# Patient Record
Sex: Female | Born: 1985 | Race: White | Hispanic: No | Marital: Married | Smoking: Former smoker
Health system: Southern US, Community
[De-identification: ages and names within clinical notes are randomized; demographics above are authoritative.]

## PROBLEM LIST (undated history)

## (undated) ENCOUNTER — Inpatient Hospital Stay (HOSPITAL_COMMUNITY): Payer: Self-pay

## (undated) DIAGNOSIS — F909 Attention-deficit hyperactivity disorder, unspecified type: Secondary | ICD-10-CM

## (undated) DIAGNOSIS — F419 Anxiety disorder, unspecified: Secondary | ICD-10-CM

## (undated) DIAGNOSIS — K589 Irritable bowel syndrome without diarrhea: Secondary | ICD-10-CM

## (undated) DIAGNOSIS — K219 Gastro-esophageal reflux disease without esophagitis: Secondary | ICD-10-CM

## (undated) DIAGNOSIS — Z87898 Personal history of other specified conditions: Secondary | ICD-10-CM

## (undated) DIAGNOSIS — E0500 Thyrotoxicosis with diffuse goiter without thyrotoxic crisis or storm: Secondary | ICD-10-CM

## (undated) DIAGNOSIS — G43909 Migraine, unspecified, not intractable, without status migrainosus: Secondary | ICD-10-CM

## (undated) DIAGNOSIS — K625 Hemorrhage of anus and rectum: Secondary | ICD-10-CM

## (undated) DIAGNOSIS — K649 Unspecified hemorrhoids: Secondary | ICD-10-CM

## (undated) DIAGNOSIS — D259 Leiomyoma of uterus, unspecified: Secondary | ICD-10-CM

## (undated) DIAGNOSIS — N83202 Unspecified ovarian cyst, left side: Secondary | ICD-10-CM

## (undated) HISTORY — PX: TONSILLECTOMY AND ADENOIDECTOMY: SUR1326

## (undated) HISTORY — PX: WISDOM TOOTH EXTRACTION: SHX21

## (undated) HISTORY — DX: Migraine, unspecified, not intractable, without status migrainosus: G43.909

## (undated) HISTORY — DX: Unspecified hemorrhoids: K64.9

## (undated) HISTORY — DX: Hemorrhage of anus and rectum: K62.5

---
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
## 2011-10-03 DIAGNOSIS — D2370 Other benign neoplasm of skin of unspecified lower limb, including hip: Secondary | ICD-10-CM | POA: Insufficient documentation

## 2011-10-03 DIAGNOSIS — D235 Other benign neoplasm of skin of trunk: Secondary | ICD-10-CM | POA: Insufficient documentation

## 2012-11-05 DIAGNOSIS — D229 Melanocytic nevi, unspecified: Secondary | ICD-10-CM

## 2012-11-05 HISTORY — DX: Melanocytic nevi, unspecified: D22.9

## 2013-04-01 ENCOUNTER — Encounter (INDEPENDENT_AMBULATORY_CARE_PROVIDER_SITE_OTHER): Payer: Self-pay | Admitting: General Surgery

## 2013-04-01 ENCOUNTER — Ambulatory Visit (INDEPENDENT_AMBULATORY_CARE_PROVIDER_SITE_OTHER): Payer: BC Managed Care – PPO | Admitting: General Surgery

## 2013-04-01 VITALS — BP 118/70 | HR 68 | Temp 97.9°F | Resp 16 | Ht 68.0 in | Wt 166.8 lb

## 2013-04-01 DIAGNOSIS — K602 Anal fissure, unspecified: Secondary | ICD-10-CM

## 2013-04-01 MED ORDER — LIDOCAINE HCL 2 % EX GEL: CUTANEOUS | Status: DC

## 2013-04-01 MED ORDER — DOCUSATE SODIUM 100 MG PO CAPS: 100.0000 mg | ORAL_CAPSULE | Freq: Two times a day (BID) | ORAL | Status: DC

## 2013-04-01 MED ORDER — LIDOCAINE HCL 2 % EX GEL: CUTANEOUS | Status: AC

## 2013-04-01 NOTE — Progress Notes (Signed)
Patient ID: Patricia Cruz, female   DOB: Sep 30, 1986, 27 y.o.   MRN: [id number]  Chief Complaint  Patient presents with  . Rectal Problems    Evaluate hems    HPI Patricia Cruz is a 27 y.o. female.  This patient is self-referred for evaluation of hemorrhoids. She says that in December 2000 and she had excision of the hemorrhoid by her gynecologist in the office but since that she has never had complete removal of the tissue and she says it has been increasing in size and discomfort since then. She says that she has bleeding with each bowel movement including bleeding into the bowl and on the tissue she says it is painful with each bowel movement and has to use baby wipes for hygiene. She says her bowels are normal but she does not take any stool softeners or fiber supplement.  HPI  Past Medical History  Diagnosis Date  . Migraine headache   . Rectal bleeding   . Hemorrhoids     Past Surgical History  Procedure Laterality Date  . Tonsillectomy  2006  . Wisdom tooth extraction  2006    No family history on file.  Social History History  Substance Use Topics  . Smoking status: Never Smoker   . Smokeless tobacco: Not on file  . Alcohol Use: Not on file    No Known Allergies  Current Outpatient Prescriptions  Medication Sig Dispense Refill  . FLUoxetine (PROZAC) 20 MG tablet Take 20 mg by mouth daily.      . Norethin Ace-Eth Estrad-FE (LOESTRIN 24 FE PO) Take by mouth.      . topiramate (TOPAMAX) 100 MG tablet Take 100 mg by mouth 2 (two) times daily.       No current facility-administered medications for this visit.    Review of Systems Review of Systems All other review of systems negative or noncontributory except as stated in the HPI  Blood pressure 118/70, pulse 68, temperature 97.9 F (36.6 C), temperature source Temporal, resp. rate 16, height 5\' 8"  (1.727 m), weight 166 lb 12.8 oz (75.66 kg).  Physical Exam Physical Exam Physical Exam  Nursing note and vitals  reviewed. Constitutional: She is oriented to person, place, and time. She appears well-developed and well-nourished. No distress.  HENT:  Head: Normocephalic and atraumatic.  Mouth/Throat: No oropharyngeal exudate.  Eyes: Conjunctivae and EOM are normal. Pupils are equal, round, and reactive to light. Right eye exhibits no discharge. Left eye exhibits no discharge. No scleral icterus.  Neck: Normal range of motion. Neck supple. No tracheal deviation present.  Cardiovascular: Normal rate, regular rhythm, normal heart sounds and intact distal pulses.   Pulmonary/Chest: Effort normal and breath sounds normal. No stridor. No respiratory distress. She has no wheezes.  Abdominal: Soft. Bowel sounds are normal. She exhibits no distension and no mass. There is no tenderness. There is no rebound and no guarding.  Musculoskeletal: Normal range of motion. She exhibits no edema and no tenderness.  Neurological: She is alert and oriented to person, place, and time.  Skin: Skin is warm and dry. No rash noted. She is not diaphoretic. No erythema. No pallor.  Psychiatric: She has a normal mood and affect. Her behavior is normal. Judgment and thought content normal.  Rectal:  Rectal exam very tender, but no internal masses.  She has a small amount of external skin anteriorly with an associated anal fissure.   Data Reviewed   Assessment    Anal fissure I think that the  majority of her symptoms are from an anal fissure which is fairly clearly visualized at the left anterior aspect of her anus closely associated with a small portion of the extra skin anteriorly. I think that her symptoms are fairly classic for anal fissure although she may have some hemorrhoids associated with this as well as though nothing obvious on exam. We had a long discussion regarding conservative management of this and I have recommended increasing fiber intake to 30 g of fiber per day as well as increasing her water intake and prescribe  some stool softeners as well. I also prescribed some topical Xylocaine to help with the discomfort associated with bowel movements and Rectiv for medical treatment of sphincter spasm and her anal fissure.  I recommended that she do this for 4-6 weeks and if she does not have improvement and resolution of her symptoms over that time then we will see her back to discuss possible rectal exam under anesthesia and possible sphincterotomy possible hemorrhoidectomy.    Plan    Stool softeners Increase fiber intake to 30 g per day Increase water intake Topical Xylocaine Rectiv F/u 4-6 weeks        Patricia Cruz DAVID 04/01/2013, 9:39 AM

## 2017-09-15 DIAGNOSIS — F419 Anxiety disorder, unspecified: Secondary | ICD-10-CM | POA: Insufficient documentation

## 2017-12-22 NOTE — L&D Delivery Note (Signed)
Patient was C/C/+2and pushed for 25 minutes with epidural.    NSVD female infant, Apgars 8/9, weight pending.  Presentation direct OA, restituted to LOA with <15 second shoulder dystocia resolved with McRobert's and suprapubic pressure. The patient had 2nd laceration repaired with 2-0 vicryl. Fundus was firm. EBL was expected amount. Placenta was delivered intact. Vagina was clear.  Delayed cord clamping done for 30-60 seconds while warming baby. Baby was vigorous and doing skin to skin with mother.  Allyn Kenner

## 2018-04-14 DIAGNOSIS — Z832 Family history of diseases of the blood and blood-forming organs and certain disorders involving the immune mechanism: Secondary | ICD-10-CM | POA: Insufficient documentation

## 2018-04-14 LAB — OB RESULTS CONSOLE GC/CHLAMYDIA
Chlamydia: NEGATIVE
Gonorrhea: NEGATIVE

## 2018-04-23 ENCOUNTER — Encounter (HOSPITAL_COMMUNITY): Payer: Self-pay

## 2018-04-23 ENCOUNTER — Ambulatory Visit (HOSPITAL_COMMUNITY): Payer: Self-pay

## 2018-04-27 ENCOUNTER — Ambulatory Visit (HOSPITAL_COMMUNITY)
Admission: RE | Admit: 2018-04-27 | Discharge: 2018-04-27 | Disposition: A | Discharge status: Home / Self Care | Payer: BLUE CROSS/BLUE SHIELD | Source: Ambulatory Visit | Attending: Obstetrics and Gynecology | Admitting: Obstetrics and Gynecology

## 2018-04-27 ENCOUNTER — Encounter (HOSPITAL_COMMUNITY): Payer: Self-pay

## 2018-04-27 DIAGNOSIS — Z832 Family history of diseases of the blood and blood-forming organs and certain disorders involving the immune mechanism: Secondary | ICD-10-CM | POA: Insufficient documentation

## 2018-04-27 HISTORY — DX: Anxiety disorder, unspecified: F41.9

## 2018-04-27 HISTORY — DX: Attention-deficit hyperactivity disorder, unspecified type: F90.9

## 2018-04-27 NOTE — Consult Note (Signed)
Maternal Fetal Medicine Consultation  Requesting Provider(s): Rogue Bussing  Primary OB: Rogue Bussing Reason for consultation: family history of FVL mutation 32yo P0 at 10+0 weeks referred for recent news that her mother has FVL mutation. She was undergoing workup for an unspecified allergic disorder and the test was performed. Her mother has no VTE history, nor does the patient or any members of her family. She reports no pregnancy problems today HPI: OB History: OB History    Gravida  1   Para      Term      Preterm      AB      Living        SAB      TAB      Ectopic      Multiple      Live Births              PMH:  Past Medical History:  Diagnosis Date  . ADHD   . Anxiety     PSH:  Past Surgical History:  Procedure Laterality Date  . TONSILLECTOMY AND ADENOIDECTOMY    . WISDOM TOOTH EXTRACTION     Meds: PNV Allergies: NKDA FH: no family history of thromboembolic phenomena, no congenital anomalies or genetic disease Soc: no tobacco, alcohol or illicit drugs  Review of Systems: no vaginal bleeding or cramping/contractions, no LOF, no nausea/vomiting. All other systems reviewed and are negative.  PE:  VS: BP 107/74  Pulse 99  Weight 208 lb GEN: well-appearing female ABD: gravid, NT  A/P: Family history of FVL mutation, unknown if hetero- or homozygous, with no personal or family history of VTE It is extremely unlikely that this patient has any pathological FVL mutation. She would need to be a homozygous FVL which is unlikely. However, FVL mutation testing will be drawn today. I have asked her to start 81mg  ASA for preeclampsia prophylaxis, and IF she is a FVL heterozygote, this will be sufficient in the face of no other risk factors for VTE. If she is a homozygote for FVL mutation then prophylactic dosage of lovenox or heparin is warrented. We will contact her with these results as soon as available  Thank you for the opportunity to be a part of the care of  Patricia Cruz. Please contact our office if we can be of further assistance.   I spent approximately 15 minutes with this patient with over 50% of time spent in face-to-face counseling.

## 2018-05-03 LAB — FACTOR 5 LEIDEN

## 2018-05-05 ENCOUNTER — Telehealth (HOSPITAL_COMMUNITY): Payer: Self-pay | Admitting: *Deleted

## 2018-05-12 LAB — OB RESULTS CONSOLE HIV ANTIBODY (ROUTINE TESTING)
HIV: NONREACTIVE
HIV: NONREACTIVE
HIV: NONREACTIVE

## 2018-05-12 LAB — OB RESULTS CONSOLE RUBELLA ANTIBODY, IGM: Rubella: IMMUNE

## 2018-07-21 ENCOUNTER — Other Ambulatory Visit (HOSPITAL_COMMUNITY): Payer: Self-pay | Admitting: Obstetrics and Gynecology

## 2018-07-21 DIAGNOSIS — Z363 Encounter for antenatal screening for malformations: Secondary | ICD-10-CM

## 2018-07-21 DIAGNOSIS — Z3A23 23 weeks gestation of pregnancy: Secondary | ICD-10-CM

## 2018-07-27 ENCOUNTER — Encounter (HOSPITAL_COMMUNITY): Payer: Self-pay

## 2018-07-27 ENCOUNTER — Ambulatory Visit (HOSPITAL_COMMUNITY)
Admission: RE | Admit: 2018-07-27 | Discharge: 2018-07-27 | Disposition: A | Discharge status: Home / Self Care | Payer: BLUE CROSS/BLUE SHIELD | Source: Ambulatory Visit | Attending: Obstetrics and Gynecology | Admitting: Obstetrics and Gynecology

## 2018-07-27 ENCOUNTER — Other Ambulatory Visit (HOSPITAL_COMMUNITY): Payer: Self-pay | Admitting: Obstetrics and Gynecology

## 2018-07-27 DIAGNOSIS — Z3A23 23 weeks gestation of pregnancy: Secondary | ICD-10-CM | POA: Diagnosis present

## 2018-07-27 DIAGNOSIS — O99212 Obesity complicating pregnancy, second trimester: Secondary | ICD-10-CM | POA: Diagnosis not present

## 2018-07-27 DIAGNOSIS — Z363 Encounter for antenatal screening for malformations: Secondary | ICD-10-CM | POA: Diagnosis not present

## 2018-07-27 DIAGNOSIS — O359XX0 Maternal care for (suspected) fetal abnormality and damage, unspecified, not applicable or unspecified: Secondary | ICD-10-CM

## 2018-07-27 IMAGING — US US MFM OB DETAIL+14 WK
1 series · 13 of 28 positions shown · non-contrast
Comparison: none

[Series 1: us mfm ob detail+14 wk · 98 acquisitions, 13 frames shown]
[im 4/98]
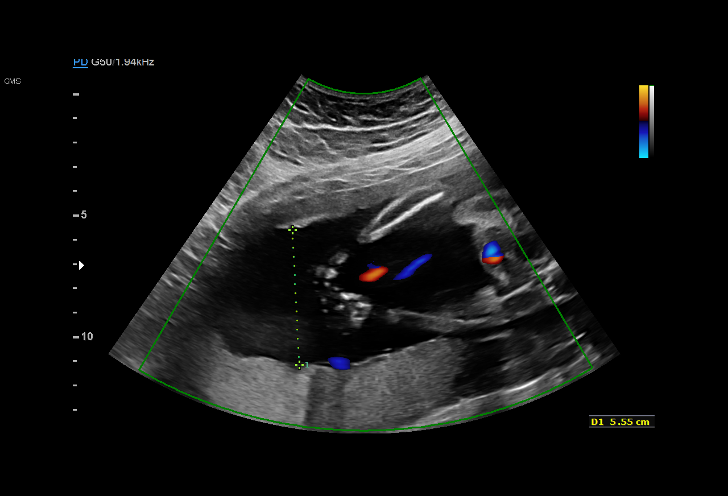
[im 11/98]
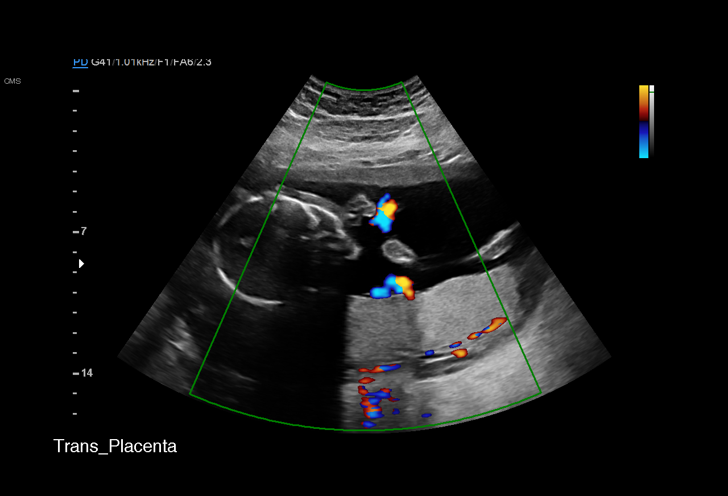
[im 18/98]
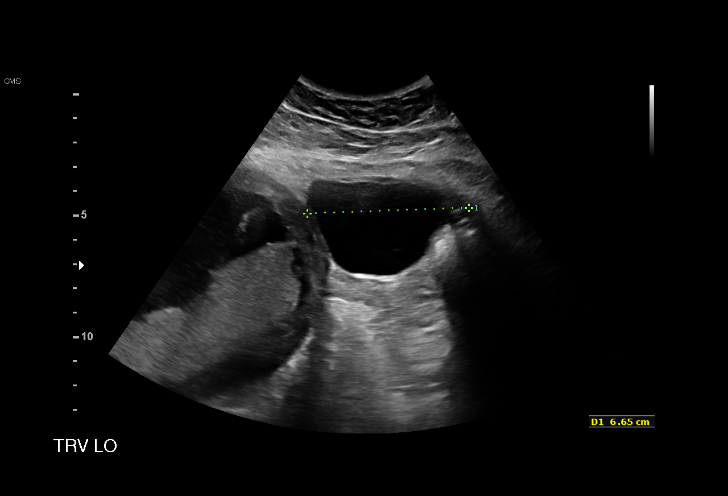
[im 26/98]
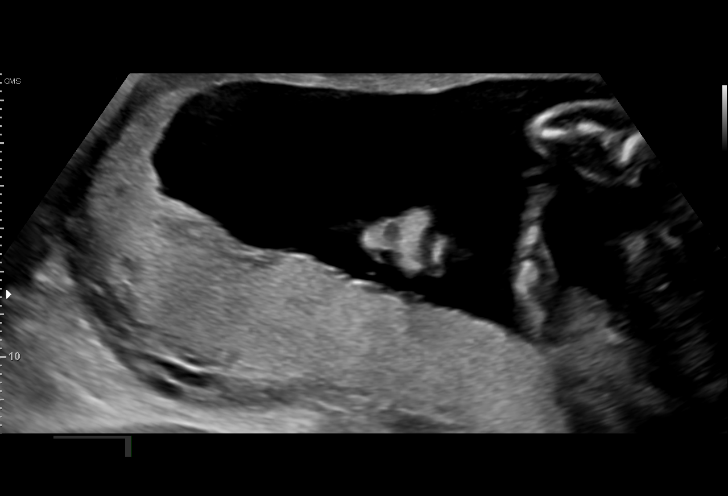
[im 33/98]
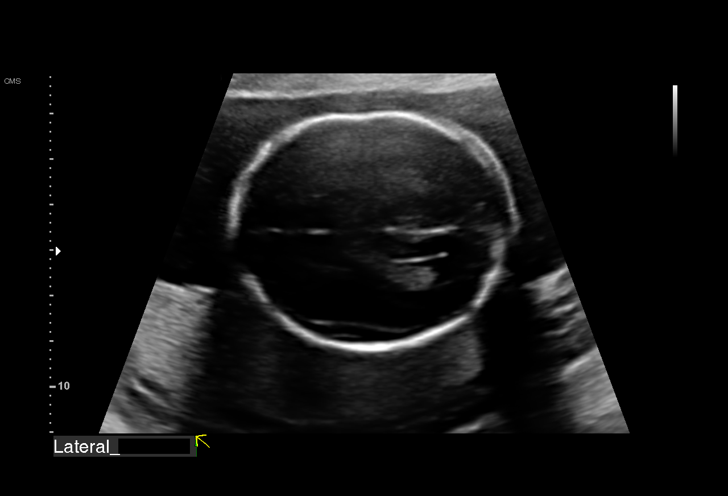
[im 40/98]
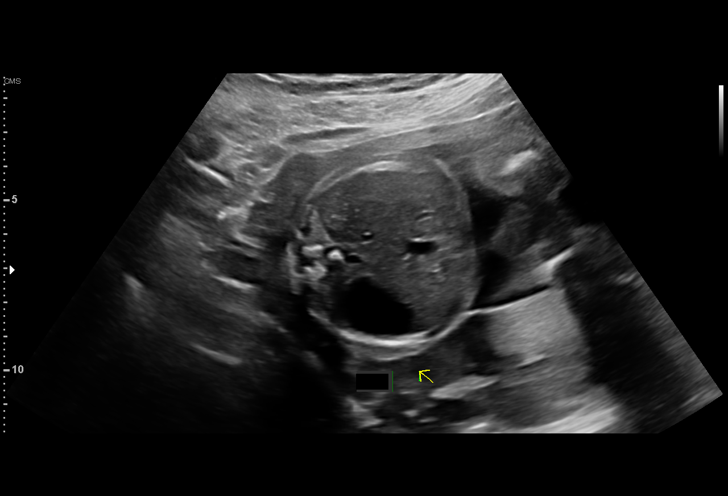
[im 51/98]
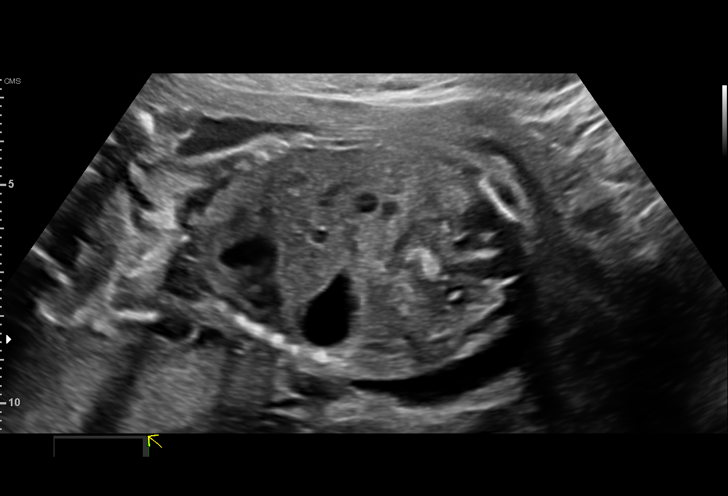
[im 58/98]
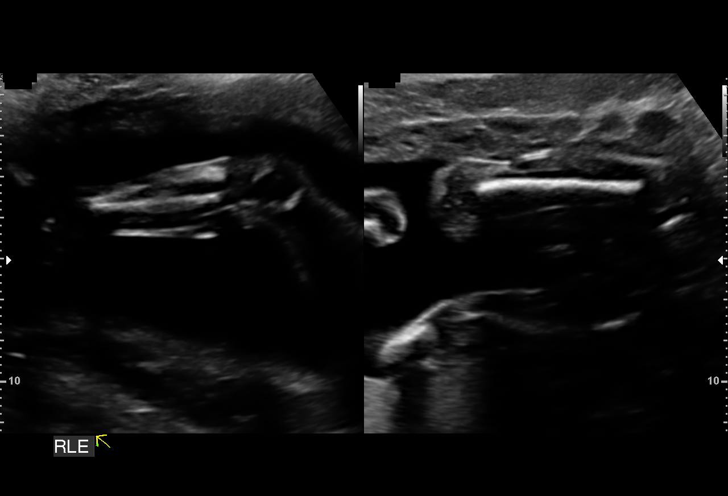
[im 65/98]
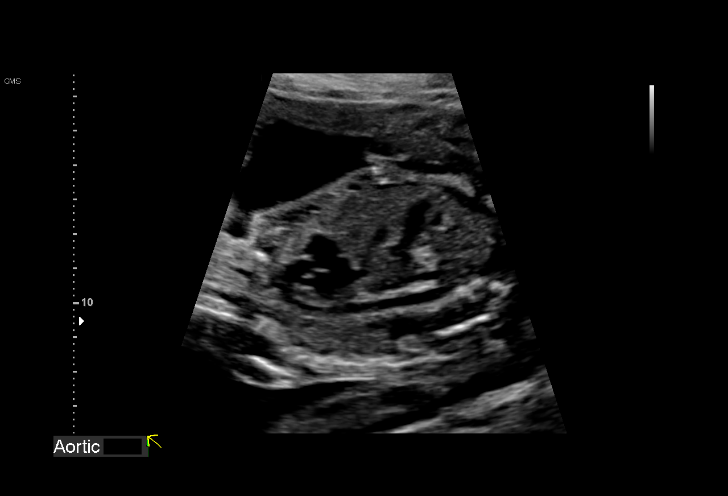
[im 72/98]
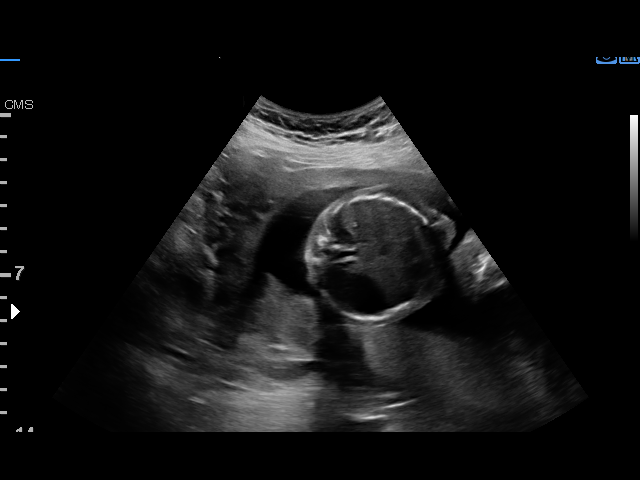
[im 80/98]
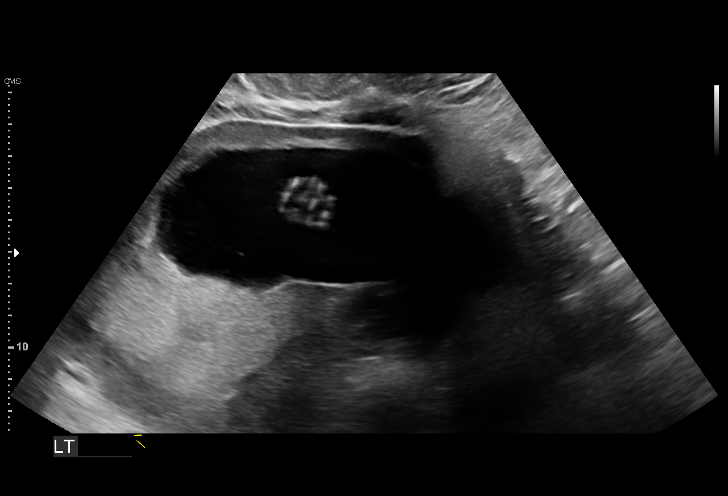
[im 87/98]
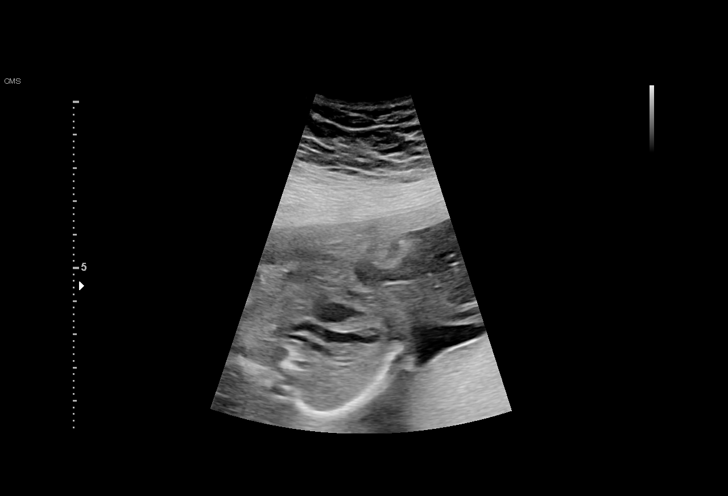
[im 94/98]
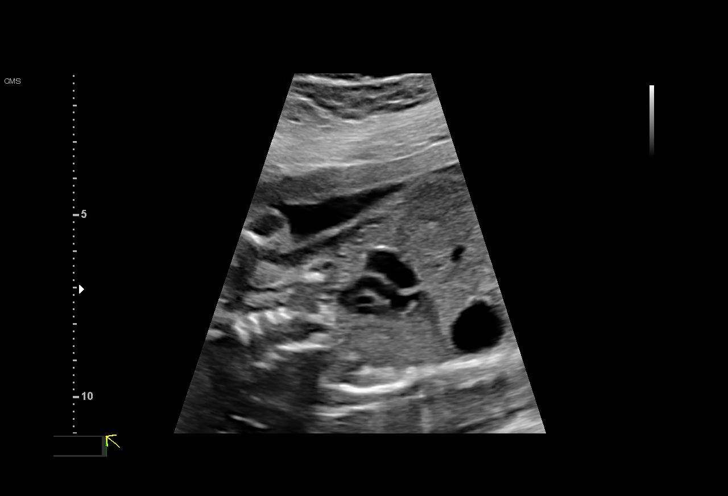

[13 of 28 positions shown; findings below may reference images not displayed]

OSEI OWUSU DO

1  OSEI OWUSU          [PHONE_NUMBER]      [PHONE_NUMBER]     [PHONE_NUMBER]
Indications

Encounter for antenatal screening for          [X7]
malformations
Obesity complicating pregnancy, second         [X7]
trimester (pregravid BMI 32)
23 weeks gestation of pregnancy
Fetal abnormality - other known or             [X7]
suspected (i.e. choriod plexus cyst, EIF,
renal pyelectasis)
OB History

Blood Type:            Height:  5'7"   Weight (lb):  209       BMI:
Gravidity:    1         Term:   0        Prem:   0        SAB:   0
TOP:          0       Ectopic:  0        Living: 0
Fetal Evaluation

Num Of Fetuses:     1
Fetal Heart         142
Rate(bpm):
Cardiac Activity:   Observed
Presentation:       Transverse, head to maternal right
Placenta:           Posterior
P. Cord Insertion:  Visualized

Amniotic Fluid
AFI FV:      Subjectively within normal limits

Largest Pocket(cm)
5.6
Biometry
BPD:      51.7  mm     G. Age:  21w 5d          7  %    CI:        71.37   %    70 - 86
FL/HC:      20.5   %    19.2 -
HC:      194.9  mm     G. Age:  21w 5d          4  %    HC/AC:      1.03        1.05 -
AC:       189   mm     G. Age:  23w 5d         63  %    FL/BPD:     77.2   %    71 - 87
FL:       39.9  mm     G. Age:  22w 6d         35  %    FL/AC:      21.1   %    20 - 24
HUM:      37.9  mm     G. Age:  23w 2d         49  %
CER:      24.9  mm     G. Age:  22w 6d         49  %
CM:        5.2  mm

Est. FW:     560  gm      1 lb 4 oz     55  %
Gestational Age

LMP:           23w 0d        Date:  [DATE]                 EDD:   [DATE]
U/S Today:     22w 4d                                        EDD:   [DATE]
Best:          23w 0d     Det. By:  LMP  ([DATE])          EDD:   [DATE]
Anatomy

Cranium:               Appears normal         Aortic Arch:            Appears normal
Cavum:                 Appears normal         Ductal Arch:            Appears normal
Ventricles:            Appears normal         Diaphragm:              Appears normal
Choroid Plexus:        Appears normal         Stomach:                Appears normal, left
sided
Cerebellum:            Appears normal         Abdomen:                Appears normal
Posterior Fossa:       Appears normal         Abdominal Wall:         Appears nml (cord
insert, abd wall)
Nuchal Fold:           Not applicable (>20    Cord Vessels:           Appears normal (3
wks GA)                                        vessel cord)
Face:                  Appears normal         Kidneys:                Appear normal
(orbits and profile)
Lips:                  Appears normal         Bladder:                Appears normal
Thoracic:              Appears normal         Spine:                  Appears normal
Heart:                 Appears normal         Upper Extremities:      Appears normal
(4CH, axis, and
situs)
RVOT:                  Appears normal         Lower Extremities:      Appears normal
LVOT:                  Appears normal

Other:  Heels and 5th digit visualized. Nasal bone visualized.
Cervix Uterus Adnexa

Cervix
Length:            3.8  cm.
Normal appearance by transabdominal scan.

Uterus
No abnormality visualized.

Left Ovary
Simple cyst measuring 6.7 x  6.6 x 4.4 cm.

Right Ovary
Within normal limits.

Cul De Sac:   No free fluid seen.

Adnexa:       No abnormality visualized.
Impression

Patient is here for fetal anatomy scan. On your office scan,
cardiac views could not be assessed.

She had MFM consultation in [DATE] (Dr. OSEI OWUSU)
because of her family history of Factor V Leiden mutation.
Subsequently, she screened negative for Factor V Leiden
mutation.

We performed fetal anatomy scan. No makers of
aneuploidies or fetal structural defects are seen. Cardiac
anatomy appears normal. Fetal biometry is consistent with
her previously-established dates. Amniotic fluid is normal and
good fetal activity is seen.
A left ovarian simple cyst is seen. I reassured the patient that
likelihood of torsion is very low. Patient does not have
symptoms pertaining to the ovarian cyst.
Recommendations

Follow-up scans as clinically indicated.

## 2018-07-30 DIAGNOSIS — N83202 Unspecified ovarian cyst, left side: Secondary | ICD-10-CM | POA: Insufficient documentation

## 2018-10-18 ENCOUNTER — Encounter (HOSPITAL_COMMUNITY): Payer: Self-pay

## 2018-10-18 ENCOUNTER — Inpatient Hospital Stay (HOSPITAL_COMMUNITY)
Admission: AD | Admit: 2018-10-18 | Discharge: 2018-10-18 | Disposition: A | Discharge status: Home / Self Care | Payer: BLUE CROSS/BLUE SHIELD | Source: Ambulatory Visit | Attending: Obstetrics | Admitting: Obstetrics

## 2018-10-18 DIAGNOSIS — N898 Other specified noninflammatory disorders of vagina: Secondary | ICD-10-CM | POA: Insufficient documentation

## 2018-10-18 DIAGNOSIS — Z0371 Encounter for suspected problem with amniotic cavity and membrane ruled out: Secondary | ICD-10-CM

## 2018-10-18 DIAGNOSIS — O26899 Other specified pregnancy related conditions, unspecified trimester: Secondary | ICD-10-CM

## 2018-10-18 DIAGNOSIS — O26893 Other specified pregnancy related conditions, third trimester: Secondary | ICD-10-CM

## 2018-10-18 DIAGNOSIS — Z3A34 34 weeks gestation of pregnancy: Secondary | ICD-10-CM | POA: Diagnosis not present

## 2018-10-18 LAB — AMNISURE RUPTURE OF MEMBRANE (ROM) NOT AT ARMC: Amnisure ROM: NEGATIVE

## 2018-10-18 LAB — URINALYSIS, ROUTINE W REFLEX MICROSCOPIC
Bilirubin Urine: NEGATIVE
Glucose, UA: NEGATIVE mg/dL
Hgb urine dipstick: NEGATIVE
Ketones, ur: NEGATIVE mg/dL
Nitrite: NEGATIVE
Protein, ur: 30 mg/dL — AB
Specific Gravity, Urine: 1.029 (ref 1.005–1.030)
pH: 6 (ref 5.0–8.0)

## 2018-10-18 LAB — POCT FERN TEST: POCT Fern Test: NEGATIVE — NL

## 2018-10-18 NOTE — MAU Provider Note (Signed)
History     CSN: [id number]  Arrival date and time: 10/18/18 1933   First Provider Initiated Contact with Patient 10/18/18 2030      Chief Complaint  Patient presents with  . Rupture of Membranes   HPI   Ms.Patricia Cruz is a 32 y.o. female G1P0 @ [redacted]w[redacted]d here with leaking of fluid. Around lunch time today she noticed that her underwear was wet. She has continued to have wetness in her underwear throughout the day. Denies contractions, has never had a large gush of fluid. Says this is her first baby and she does not know what to expect.  No bleeding.   OB History    Gravida  1   Para      Term      Preterm      AB      Living        SAB      TAB      Ectopic      Multiple      Live Births              Past Medical History:  Diagnosis Date  . ADHD   . Anxiety     Past Surgical History:  Procedure Laterality Date  . TONSILLECTOMY AND ADENOIDECTOMY    . WISDOM TOOTH EXTRACTION      No family history on file.  Social History   Tobacco Use  . Smoking status: Never Smoker  . Smokeless tobacco: Never Used  Substance Use Topics  . Alcohol use: Not Currently  . Drug use: Never    Allergies: No Known Allergies  Medications Prior to Admission  Medication Sig Dispense Refill Last Dose  . aspirin EC 81 MG tablet Take 81 mg by mouth daily.   10/18/2018 at Unknown time  . Prenatal Vit-Fe Fumarate-FA (PRENATAL VITAMIN PO) Take by mouth.   10/18/2018 at Unknown time  . acetaminophen (TYLENOL) 325 MG tablet Take 650 mg by mouth every 6 (six) hours as needed.   Taking   Results for orders placed or performed during the hospital encounter of 10/18/18 (from the past 48 hour(s))  Urinalysis, Routine w reflex microscopic     Status: Abnormal   Collection Time: 10/18/18  8:10 PM  Result Value Ref Range   Color, Urine YELLOW YELLOW   APPearance HAZY (A) CLEAR   Specific Gravity, Urine 1.029 1.005 - 1.030   pH 6.0 5.0 - 8.0   Glucose, UA NEGATIVE NEGATIVE  mg/dL   Hgb urine dipstick NEGATIVE NEGATIVE   Bilirubin Urine NEGATIVE NEGATIVE   Ketones, ur NEGATIVE NEGATIVE mg/dL   Protein, ur 30 (A) NEGATIVE mg/dL   Nitrite NEGATIVE NEGATIVE   Leukocytes, UA SMALL (A) NEGATIVE   RBC / HPF 0-5 0 - 5 RBC/hpf   WBC, UA 11-20 0 - 5 WBC/hpf   Bacteria, UA MANY (A) NONE SEEN   Squamous Epithelial / LPF 6-10 0 - 5   Mucus PRESENT     Comment: Performed at [facility], [address] [place], [place] [address]  Amnisure rupture of membrane (rom)not at [facility]     Status: None   Collection Time: 10/18/18  8:44 PM  Result Value Ref Range   Amnisure ROM NEGATIVE     Comment: Performed at [facility], [address] [place], [place] [address]   Review of Systems  Gastrointestinal: Negative for abdominal pain.  Genitourinary: Positive for vaginal discharge. Negative for dysuria and vaginal bleeding.   Physical  Exam   Blood pressure 125/80, pulse 99, temperature 98.8 F (37.1 C), resp. rate 19, height 5' 7.5" (1.715 m), weight 108.9 kg, last menstrual period 02/16/2018.  Physical Exam  Constitutional: She is oriented to person, place, and time. She appears well-developed and well-nourished. No distress.  HENT:  Head: Normocephalic.  Genitourinary:  Genitourinary Comments: Vagina - Small amount of mucoid vaginal discharge, no pooling of discharge  Cervix - No contact bleeding, no active bleeding  Bimanual exam: deferred  Chaperone present for exam.   Musculoskeletal: Normal range of motion.  Neurological: She is alert and oriented to person, place, and time.  Skin: Skin is warm. She is not diaphoretic.  Psychiatric: Her behavior is normal.   Fetal Tracing: Baseline: 125 bpm Variability: Moderate  Accelerations: 15x15 Decelerations: None Toco: 1 contraction   MAU Course  Procedures  None  MDM  Fern slide: negative Amnisure: negative Urine culture sent   Assessment and Plan   A:  1. Encounter for suspected premature  rupture of amniotic membranes, with rupture of membranes not found   2. Vaginal discharge during pregnancy, antepartum   3. [redacted] weeks gestation of pregnancy      P:  Discharge home in stable condition Strict return precautions F/u in the office as scheduled If symptoms continue return to MAU Discussed normalcy of increase in discharge in 3rd trimester of pregnancy.   Lezlie Lye, NP 10/18/2018 9:17 PM

## 2018-10-18 NOTE — Discharge Instructions (Signed)

## 2018-10-18 NOTE — MAU Note (Addendum)
Since lunchtime has noticed her underwear is constantly wet.  Uncertain whether it could be amniotic fluid.  No ctx/VB.  + FM.  No complications with her pregnancy.  Baby was breech during her last ultrasound 2 weeks ago.

## 2018-10-20 LAB — CULTURE, OB URINE
Culture: NO GROWTH
Special Requests: NORMAL

## 2018-10-29 LAB — OB RESULTS CONSOLE GBS: GBS: POSITIVE

## 2018-11-26 ENCOUNTER — Other Ambulatory Visit: Payer: Self-pay | Admitting: Obstetrics and Gynecology

## 2018-11-29 ENCOUNTER — Inpatient Hospital Stay (HOSPITAL_COMMUNITY)
Admission: AD | Admit: 2018-11-29 | Discharge: 2018-12-01 | DRG: 807 | Disposition: A | Discharge status: Home / Self Care | Payer: BLUE CROSS/BLUE SHIELD | Attending: Obstetrics and Gynecology | Admitting: Obstetrics and Gynecology

## 2018-11-29 ENCOUNTER — Other Ambulatory Visit: Payer: Self-pay

## 2018-11-29 ENCOUNTER — Inpatient Hospital Stay (HOSPITAL_COMMUNITY): Payer: BLUE CROSS/BLUE SHIELD | Admitting: Anesthesiology

## 2018-11-29 ENCOUNTER — Encounter (HOSPITAL_COMMUNITY): Payer: Self-pay

## 2018-11-29 DIAGNOSIS — O99824 Streptococcus B carrier state complicating childbirth: Secondary | ICD-10-CM | POA: Diagnosis present

## 2018-11-29 DIAGNOSIS — Z3A40 40 weeks gestation of pregnancy: Secondary | ICD-10-CM | POA: Diagnosis not present

## 2018-11-29 DIAGNOSIS — Z3483 Encounter for supervision of other normal pregnancy, third trimester: Secondary | ICD-10-CM | POA: Diagnosis present

## 2018-11-29 LAB — TYPE AND SCREEN
ABO/RH(D): O POS
Antibody Screen: NEGATIVE

## 2018-11-29 LAB — ABO/RH: ABO/RH(D): O POS

## 2018-11-29 LAB — CBC
HCT: 35 % — ABNORMAL LOW (ref 36.0–46.0)
Hemoglobin: 10.9 g/dL — ABNORMAL LOW (ref 12.0–15.0)
MCH: 28.7 pg (ref 26.0–34.0)
MCHC: 31.1 g/dL (ref 30.0–36.0)
MCV: 92.1 fL (ref 80.0–100.0)
Platelets: 208 10*3/uL (ref 150–400)
RBC: 3.8 MIL/uL — ABNORMAL LOW (ref 3.87–5.11)
RDW: 15.3 % (ref 11.5–15.5)
WBC: 10.6 10*3/uL — ABNORMAL HIGH (ref 4.0–10.5)

## 2018-11-29 LAB — RPR: RPR Ser Ql: NONREACTIVE

## 2018-11-29 MED ORDER — FLEET ENEMA 7-19 GM/118ML RE ENEM: 1.0000 | ENEMA | RECTAL | Status: DC | PRN

## 2018-11-29 MED ORDER — OXYCODONE-ACETAMINOPHEN 5-325 MG PO TABS: 2.0000 | ORAL_TABLET | ORAL | Status: DC | PRN

## 2018-11-29 MED ORDER — LACTATED RINGERS IV SOLN
INTRAVENOUS | Status: DC
  Administered 2018-11-29: 02:00:00 via INTRAVENOUS

## 2018-11-29 MED ORDER — OXYTOCIN 40 UNITS IN LACTATED RINGERS INFUSION - SIMPLE MED
1.0000 m[IU]/min | INTRAVENOUS | Status: DC
  Administered 2018-11-29: 2 m[IU]/min via INTRAVENOUS

## 2018-11-29 MED ORDER — LIDOCAINE HCL (PF) 1 % IJ SOLN
INTRAMUSCULAR | Status: DC | PRN
  Administered 2018-11-29: 5 mL via EPIDURAL

## 2018-11-29 MED ORDER — OXYTOCIN BOLUS FROM INFUSION: 500.0000 mL | Freq: Once | INTRAVENOUS | Status: DC

## 2018-11-29 MED ORDER — SODIUM CHLORIDE 0.9 % IV SOLN
5.0000 10*6.[IU] | Freq: Once | INTRAVENOUS | Status: AC
  Administered 2018-11-29: 5 10*6.[IU] via INTRAVENOUS
  Filled 2018-11-29: qty 5

## 2018-11-29 MED ORDER — ERYTHROMYCIN 5 MG/GM OP OINT
TOPICAL_OINTMENT | OPHTHALMIC | Status: AC
  Filled 2018-11-29: qty 1

## 2018-11-29 MED ORDER — PHENYLEPHRINE 40 MCG/ML (10ML) SYRINGE FOR IV PUSH (FOR BLOOD PRESSURE SUPPORT): 80.0000 ug | PREFILLED_SYRINGE | INTRAVENOUS | Status: DC | PRN

## 2018-11-29 MED ORDER — LACTATED RINGERS IV SOLN: 500.0000 mL | Freq: Once | INTRAVENOUS | Status: DC

## 2018-11-29 MED ORDER — PHENYLEPHRINE 40 MCG/ML (10ML) SYRINGE FOR IV PUSH (FOR BLOOD PRESSURE SUPPORT)
80.0000 ug | PREFILLED_SYRINGE | INTRAVENOUS | Status: DC | PRN
  Filled 2018-11-29 (×2): qty 10

## 2018-11-29 MED ORDER — EPHEDRINE 5 MG/ML INJ: 10.0000 mg | INTRAVENOUS | Status: DC | PRN

## 2018-11-29 MED ORDER — LIDOCAINE HCL (PF) 1 % IJ SOLN
30.0000 mL | INTRAMUSCULAR | Status: DC | PRN
  Administered 2018-11-29: 30 mL via SUBCUTANEOUS
  Filled 2018-11-29: qty 30

## 2018-11-29 MED ORDER — LACTATED RINGERS IV SOLN: 500.0000 mL | INTRAVENOUS | Status: DC | PRN

## 2018-11-29 MED ORDER — ACETAMINOPHEN 325 MG PO TABS: 650.0000 mg | ORAL_TABLET | ORAL | Status: DC | PRN

## 2018-11-29 MED ORDER — TERBUTALINE SULFATE 1 MG/ML IJ SOLN
0.2500 mg | Freq: Once | INTRAMUSCULAR | Status: DC | PRN
  Filled 2018-11-29: qty 1

## 2018-11-29 MED ORDER — LACTATED RINGERS IV SOLN
500.0000 mL | Freq: Once | INTRAVENOUS | Status: AC
  Administered 2018-11-29: 500 mL via INTRAVENOUS

## 2018-11-29 MED ORDER — EPHEDRINE 5 MG/ML INJ
10.0000 mg | INTRAVENOUS | Status: DC | PRN
  Filled 2018-11-29: qty 2

## 2018-11-29 MED ORDER — DIPHENHYDRAMINE HCL 50 MG/ML IJ SOLN: 12.5000 mg | INTRAMUSCULAR | Status: DC | PRN

## 2018-11-29 MED ORDER — ONDANSETRON HCL 4 MG/2ML IJ SOLN: 4.0000 mg | Freq: Four times a day (QID) | INTRAMUSCULAR | Status: DC | PRN

## 2018-11-29 MED ORDER — FENTANYL 2.5 MCG/ML BUPIVACAINE 1/10 % EPIDURAL INFUSION (WH - ANES)
14.0000 mL/h | INTRAMUSCULAR | Status: DC | PRN
  Administered 2018-11-29 (×2): 14 mL/h via EPIDURAL
  Filled 2018-11-29 (×2): qty 100

## 2018-11-29 MED ORDER — OXYTOCIN 40 UNITS IN LACTATED RINGERS INFUSION - SIMPLE MED
2.5000 [IU]/h | INTRAVENOUS | Status: DC
  Administered 2018-11-29: 2.5 [IU]/h via INTRAVENOUS
  Filled 2018-11-29: qty 1000

## 2018-11-29 MED ORDER — MISOPROSTOL 25 MCG QUARTER TABLET
25.0000 ug | ORAL_TABLET | ORAL | Status: DC | PRN
  Administered 2018-11-29 (×2): 25 ug via VAGINAL
  Filled 2018-11-29 (×3): qty 1

## 2018-11-29 MED ORDER — PHENYLEPHRINE 40 MCG/ML (10ML) SYRINGE FOR IV PUSH (FOR BLOOD PRESSURE SUPPORT)
80.0000 ug | PREFILLED_SYRINGE | INTRAVENOUS | Status: DC | PRN
  Filled 2018-11-29: qty 10

## 2018-11-29 MED ORDER — PENICILLIN G 3 MILLION UNITS IVPB - SIMPLE MED
3.0000 10*6.[IU] | INTRAVENOUS | Status: DC
  Administered 2018-11-29: 3 10*6.[IU] via INTRAVENOUS
  Filled 2018-11-29 (×5): qty 100

## 2018-11-29 MED ORDER — OXYCODONE-ACETAMINOPHEN 5-325 MG PO TABS: 1.0000 | ORAL_TABLET | ORAL | Status: DC | PRN

## 2018-11-29 MED ORDER — SOD CITRATE-CITRIC ACID 500-334 MG/5ML PO SOLN: 30.0000 mL | ORAL | Status: DC | PRN

## 2018-11-29 NOTE — Anesthesia Preprocedure Evaluation (Signed)
Anesthesia Evaluation  Patient identified by MRN, date of birth, ID band Patient awake    Reviewed: Allergy & Precautions, NPO status , Patient's Chart, lab work & pertinent test results  Airway Mallampati: III  TM Distance: >3 FB Neck ROM: Full    Dental no notable dental hx.    Pulmonary neg pulmonary ROS,    Pulmonary exam normal breath sounds clear to auscultation       Cardiovascular Exercise Tolerance: Good negative cardio ROS Normal cardiovascular exam Rhythm:Regular Rate:Normal     Neuro/Psych PSYCHIATRIC DISORDERS Anxiety negative neurological ROS     GI/Hepatic   Endo/Other    Renal/GU      Musculoskeletal   Abdominal (+) + obese,   Peds  Hematology   Anesthesia Other Findings   Reproductive/Obstetrics (+) Pregnancy                             Lab Results  Component Value Date   WBC 10.6 (H) 11/29/2018   HGB 10.9 (L) 11/29/2018   HCT 35.0 (L) 11/29/2018   MCV 92.1 11/29/2018   PLT 208 11/29/2018    Anesthesia Physical Anesthesia Plan  ASA: III  Anesthesia Plan: Epidural   Post-op Pain Management:    Induction:   PONV Risk Score and Plan: Treatment may vary due to age or medical condition  Airway Management Planned: Natural Airway  Additional Equipment:   Intra-op Plan:   Post-operative Plan:   Informed Consent: I have reviewed the patients History and Physical, chart, labs and discussed the procedure including the risks, benefits and alternatives for the proposed anesthesia with the patient or authorized representative who has indicated his/her understanding and acceptance.     Plan Discussed with:   Anesthesia Plan Comments:         Anesthesia Quick Evaluation

## 2018-11-29 NOTE — Anesthesia Procedure Notes (Signed)
Epidural Patient location during procedure: OB Start time: 11/29/2018 2:03 PM End time: 11/29/2018 2:19 PM  Staffing Anesthesiologist: Barnet Glasgow, MD Performed: anesthesiologist   Preanesthetic Checklist Completed: patient identified, site marked, surgical consent, pre-op evaluation, timeout performed, IV checked, risks and benefits discussed and monitors and equipment checked  Epidural Patient position: sitting Prep: site prepped and draped and DuraPrep Patient monitoring: continuous pulse ox and blood pressure Approach: midline Location: L3-L4 Injection technique: LOR air  Needle:  Needle type: Tuohy  Needle gauge: 17 G Needle length: 9 cm and 9 Needle insertion depth: 8 cm Catheter type: closed end flexible Catheter size: 19 Gauge Catheter at skin depth: 13 cm Test dose: negative  Assessment Events: blood not aspirated, injection not painful, no injection resistance, negative IV test and no paresthesia  Additional Notes Patient identified. Risks/Benefits/Options discussed with patient including but not limited to bleeding, infection, nerve damage, paralysis, failed block, incomplete pain control, headache, blood pressure changes, nausea, vomiting, reactions to medication both or allergic, itching and postpartum back pain. Confirmed with bedside nurse the patient's most recent platelet count. Confirmed with patient that they are not currently taking any anticoagulation, have any bleeding history or any family history of bleeding disorders. Patient expressed understanding and wished to proceed. All questions were answered. Sterile technique was used throughout the entire procedure. Please see nursing notes for vital signs. Test dose was given through epidural needle and negative prior to continuing to dose epidural or start infusion. Warning signs of high block given to the patient including shortness of breath, tingling/numbness in hands, complete motor block, or any  concerning symptoms with instructions to call for help. Patient was given instructions on fall risk and not to get out of bed. All questions and concerns addressed with instructions to call with any issues. 1 Attempt (S) . Patient tolerated procedure well.

## 2018-11-29 NOTE — Anesthesia Pain Management Evaluation Note (Signed)
  CRNA Pain Management Visit Note  Patient: Patricia Cruz, 32 y.o., female  "Hello I am a member of the anesthesia team at [facility]. We have an anesthesia team available at all times to provide care throughout the hospital, including epidural management and anesthesia for C-section. I don't know your plan for the delivery whether it a natural birth, water birth, IV sedation, nitrous supplementation, doula or epidural, but we want to meet your pain goals."   1.Was your pain managed to your expectations on prior hospitalizations?   No prior hospitalizations  2.What is your expectation for pain management during this hospitalization?     Epidural  3.How can we help you reach that goal? epidural  Record the patient's initial score and the patient's pain goal.   Pain: 2  Pain Goal: 5 [facility] wants you to be able to say your pain was always managed very well.  Epimenio Schetter 11/29/2018

## 2018-11-29 NOTE — H&P (Signed)
32 y.o. [redacted]w[redacted]d  G1P0 comes in for schedule IOL for >40weeks.  Otherwise has good fetal movement and no bleeding.  Past Medical History:  Diagnosis Date  . ADHD   . Anxiety     Past Surgical History:  Procedure Laterality Date  . TONSILLECTOMY AND ADENOIDECTOMY    . WISDOM TOOTH EXTRACTION      OB History  Gravida Para Term Preterm AB Living  1            SAB TAB Ectopic Multiple Live Births               # Outcome Date GA Lbr Len/2nd Weight Sex Delivery Anes PTL Lv  1 Current             Social History   Socioeconomic History  . Marital status: Married    Spouse name: Not on file  . Number of children: Not on file  . Years of education: Not on file  . Highest education level: Not on file  Occupational History  . Not on file  Social Needs  . Financial resource strain: Not on file  . Food insecurity:    Worry: Not on file    Inability: Not on file  . Transportation needs:    Medical: Not on file    Non-medical: Not on file  Tobacco Use  . Smoking status: Never Smoker  . Smokeless tobacco: Never Used  Substance and Sexual Activity  . Alcohol use: Not Currently  . Drug use: Never  . Sexual activity: Not on file  Lifestyle  . Physical activity:    Days per week: Not on file    Minutes per session: Not on file  . Stress: Not on file  Relationships  . Social connections:    Talks on phone: Not on file    Gets together: Not on file    Attends religious service: Not on file    Active member of club or organization: Not on file    Attends meetings of clubs or organizations: Not on file    Relationship status: Not on file  . Intimate partner violence:    Fear of current or ex partner: Not on file    Emotionally abused: Not on file    Physically abused: Not on file    Forced sexual activity: Not on file  Other Topics Concern  . Not on file  Social History Narrative  . Not on file   Patient has no known allergies.    Prenatal Transfer Tool  Maternal Diabetes:  No Genetic Screening: Normal Maternal Ultrasounds/Referrals: Normal Fetal Ultrasounds or other Referrals:  Referred to Materal Fetal Medicine for family h.o Factor V Leiden mutation, w/u negative Maternal Substance Abuse:  No Significant Maternal Medications:  None Significant Maternal Lab Results: Lab values include: Group B Strep positive  Other PNC: uncomplicated.    Vitals:   11/29/18 0553 11/29/18 0700 11/29/18 0800 11/29/18 0900  BP: 129/83 131/83 130/82 131/82  Pulse: 95 88 82 87  Resp: 16 18 16 16   Temp:   97.7 F (36.5 C)   TempSrc:   Oral   Weight:      Height:        Lungs/Cor:  NAD Abdomen:  soft, gravid Ex:  no cords, erythema SVE:  1.5/50/-3 this am FHTs:  140, good STV, NST R; Cat 1 tracing. Toco:  irregular   A/P   Admit for IOL at 40.6  GBS Pos- PCN started  Cytotec  for cervical ripening  Other routine care  [place], [place]

## 2018-11-30 ENCOUNTER — Encounter (HOSPITAL_COMMUNITY): Payer: Self-pay

## 2018-11-30 LAB — CBC
HCT: 31.7 % — ABNORMAL LOW (ref 36.0–46.0)
Hemoglobin: 10.1 g/dL — ABNORMAL LOW (ref 12.0–15.0)
MCH: 28.2 pg (ref 26.0–34.0)
MCHC: 31.9 g/dL (ref 30.0–36.0)
MCV: 88.5 fL (ref 80.0–100.0)
Platelets: 256 10*3/uL (ref 150–400)
RBC: 3.58 MIL/uL — ABNORMAL LOW (ref 3.87–5.11)
RDW: 14.8 % (ref 11.5–15.5)
WBC: 16.4 10*3/uL — ABNORMAL HIGH (ref 4.0–10.5)
nRBC: 0 % (ref 0.0–0.2)

## 2018-11-30 MED ORDER — DIBUCAINE 1 % RE OINT
1.0000 "application " | TOPICAL_OINTMENT | RECTAL | Status: DC | PRN
  Administered 2018-11-30 (×2): 1 via RECTAL
  Filled 2018-11-30 (×2): qty 28

## 2018-11-30 MED ORDER — OXYCODONE-ACETAMINOPHEN 5-325 MG PO TABS
2.0000 | ORAL_TABLET | ORAL | Status: DC | PRN
  Administered 2018-11-30: 2 via ORAL
  Filled 2018-11-30 (×2): qty 2

## 2018-11-30 MED ORDER — PRENATAL MULTIVITAMIN CH
1.0000 | ORAL_TABLET | Freq: Every day | ORAL | Status: DC
  Administered 2018-11-30 – 2018-12-01 (×2): 1 via ORAL
  Filled 2018-11-30 (×2): qty 1

## 2018-11-30 MED ORDER — SENNOSIDES-DOCUSATE SODIUM 8.6-50 MG PO TABS
2.0000 | ORAL_TABLET | ORAL | Status: DC
  Administered 2018-11-30 (×2): 2 via ORAL
  Filled 2018-11-30 (×2): qty 2

## 2018-11-30 MED ORDER — COCONUT OIL OIL: 1.0000 "application " | TOPICAL_OIL | Status: DC | PRN

## 2018-11-30 MED ORDER — OXYCODONE-ACETAMINOPHEN 5-325 MG PO TABS
1.0000 | ORAL_TABLET | ORAL | Status: DC | PRN
  Administered 2018-11-30 – 2018-12-01 (×5): 1 via ORAL
  Filled 2018-11-30 (×5): qty 1

## 2018-11-30 MED ORDER — DIPHENHYDRAMINE HCL 25 MG PO CAPS: 25.0000 mg | ORAL_CAPSULE | Freq: Four times a day (QID) | ORAL | Status: DC | PRN

## 2018-11-30 MED ORDER — IBUPROFEN 600 MG PO TABS
600.0000 mg | ORAL_TABLET | Freq: Four times a day (QID) | ORAL | Status: DC
  Administered 2018-11-30 – 2018-12-01 (×7): 600 mg via ORAL
  Filled 2018-11-30 (×7): qty 1

## 2018-11-30 MED ORDER — WITCH HAZEL-GLYCERIN EX PADS
1.0000 "application " | MEDICATED_PAD | CUTANEOUS | Status: DC | PRN
  Administered 2018-11-30: 1 via TOPICAL

## 2018-11-30 MED ORDER — ONDANSETRON HCL 4 MG/2ML IJ SOLN: 4.0000 mg | INTRAMUSCULAR | Status: DC | PRN

## 2018-11-30 MED ORDER — TETANUS-DIPHTH-ACELL PERTUSSIS 5-2.5-18.5 LF-MCG/0.5 IM SUSP: 0.5000 mL | Freq: Once | INTRAMUSCULAR | Status: DC

## 2018-11-30 MED ORDER — SIMETHICONE 80 MG PO CHEW: 80.0000 mg | CHEWABLE_TABLET | ORAL | Status: DC | PRN

## 2018-11-30 MED ORDER — BENZOCAINE-MENTHOL 20-0.5 % EX AERO
1.0000 "application " | INHALATION_SPRAY | CUTANEOUS | Status: DC | PRN
  Administered 2018-11-30: 1 via TOPICAL
  Filled 2018-11-30: qty 56

## 2018-11-30 MED ORDER — ACETAMINOPHEN 325 MG PO TABS: 650.0000 mg | ORAL_TABLET | ORAL | Status: DC | PRN

## 2018-11-30 MED ORDER — ONDANSETRON HCL 4 MG PO TABS
4.0000 mg | ORAL_TABLET | ORAL | Status: DC | PRN
  Administered 2018-11-30: 4 mg via ORAL
  Filled 2018-11-30: qty 1

## 2018-11-30 MED ORDER — ZOLPIDEM TARTRATE 5 MG PO TABS: 5.0000 mg | ORAL_TABLET | Freq: Every evening | ORAL | Status: DC | PRN

## 2018-11-30 NOTE — Lactation Note (Signed)
This note was copied from a baby's chart. Lactation Consultation note: Mother has large breast with dependent edema. Areola swollen with flat short nipples.  Infant latched once on the tip of the left nipple. Mother has a positional strip on the left nipple, as well as a suck bruise on the areola.   Multiple attempts to latch infant. Unable to obtain latch.  #20 Nipple shield applied and infant was given a few ml of formula.  Infant then finger fed 5-7 ml of formula with gloved finger and curved tip syringe.  Advised mother to post pump for 15 mins after each feeding.   Mother reports that she only wants infant to get colostrum while in the hospital and then she plans to exclusively pump.  Lots of teaching and assistance given to mother.  Mother to page for latch assistance as needed.   Patient Name: Patricia Cruz [id number] Date: 11/30/2018 Reason for consult: Follow-up assessment   Maternal Data    Feeding Feeding Type: Formula  LATCH Score Latch: Repeated attempts needed to sustain latch, nipple held in mouth throughout feeding, stimulation needed to elicit sucking reflex.  Audible Swallowing: None  Type of Nipple: Flat  Comfort (Breast/Nipple): Filling, red/small blisters or bruises, mild/mod discomfort  Hold (Positioning): Assistance needed to correctly position infant at breast and maintain latch.  LATCH Score: 4  Interventions Interventions: Assisted with latch;Skin to skin;Hand express;Reverse pressure;Breast compression;Adjust position;Support pillows;Position options;Hand pump;DEBP  Lactation Tools Discussed/Used Tools: Nipple Shields Nipple shield size: 20   Consult Status Consult Status: Follow-up Date: 12/01/18 Follow-up type: In-patient    Jess Barters [facility] 11/30/2018, 3:27 PM

## 2018-11-30 NOTE — Lactation Note (Addendum)
This note was copied from a baby's chart. Lactation Consultation Note  Patient Name: Patricia Cruz Date: 11/30/2018 Reason for consult: Initial assessment;1st time breastfeeding;Term P1, 7 hour female infant, 1% weight loss. Per mom, she did not attend any BF classes in pregnancy. NT changed stool (meconium) prior to weighing infant. Per mom, she has a Medela DEBP at home. Mom attempted to latch infant to right breast using the  football hold, infant reluctant to latch at this time. Mom has large breast and flat nipples. Mom has breast shells to wear and fitted 20 mm NS due  Infant repeatedly not sustaining  Latch. Mom will BF according hunger cues and not exceed 3 hours without BF infant.  Infant  Latched few minutes with 20 mm NS and had 3 episodes of emesis while attempting BF,  LC alerted nurse.  Mom shown how to use DEBP & how to disassemble, clean, & reassemble parts. Mom plans to pump with DEBP  every 3 hours  Reviewed Baby & Me book's Breastfeeding Basics.  LC discussed I & O. Mom will continue to work towards latching infant to breast and ask for assistance from Nurse and Max as prn.  Mom made aware of O/P services, breastfeeding support groups, community resources, and our phone # for post-discharge questions.  Maternal Data Formula Feeding for Exclusion: No Has patient been taught Hand Expression?: Yes Does the patient have breastfeeding experience prior to this delivery?: No  Feeding Feeding Type: Breast Fed  LATCH Score Latch: Too sleepy or reluctant, no latch achieved, no sucking elicited.  Audible Swallowing: None  Type of Nipple: Flat  Comfort (Breast/Nipple): Soft / non-tender  Hold (Positioning): Assistance needed to correctly position infant at breast and maintain latch.  LATCH Score: 4  Interventions Interventions: Breast feeding basics reviewed;Assisted with latch;Skin to skin;Support pillows;Adjust position;Breast compression;Shells;DEBP;Hand  express;Position options  Lactation Tools Discussed/Used Tools: Shells;Nipple Shields Nipple shield size: 20 Breast pump type: Double-Electric Breast Pump Pump Review: Milk Storage;Setup, frequency, and cleaning Initiated by:: Vicente Serene, IBCLC Date initiated:: 11/30/18   Consult Status Consult Status: Follow-up Date: 11/30/18 Follow-up type: In-patient    Vicente Serene 11/30/2018, 6:01 AM

## 2018-11-30 NOTE — Progress Notes (Signed)
Patient is eating, ambulating, voiding.  Pain control is good.  Vitals:   11/29/18 2301 11/30/18 0057 11/30/18 0138 11/30/18 0623  BP: 133/86 133/84 134/83 121/82  Pulse: 100 (!) 107 99 64  Resp:  19 18 18   Temp: 98 F (36.7 C) 98 F (36.7 C) 99.5 F (37.5 C) 98 F (36.7 C)  TempSrc: Oral  Oral Oral  SpO2:      Weight:      Height:        Fundus firm Perineum without swelling.  Lab Results  Component Value Date   WBC 16.4 (H) 11/30/2018   HGB 10.1 (L) 11/30/2018   HCT 31.7 (L) 11/30/2018   MCV 88.5 11/30/2018   PLT 256 11/30/2018    --/--/O POS, O POS Performed at [facility], [address], [place], [place] [address]  (12/09 0308)/[place]  A/P Post partum day 1.  Routine care.  Expect d/c tomorrow.    Binnie Droessler A

## 2018-11-30 NOTE — Anesthesia Postprocedure Evaluation (Signed)
Anesthesia Post Note  Patient: Patricia Cruz  Procedure(s) Performed: AN AD [place]     Patient location during evaluation: Mother Baby Anesthesia Type: Epidural Level of consciousness: awake and alert Pain management: pain level controlled Cardiovascular status: stable Postop Assessment: no headache, adequate PO intake, no backache, patient able to bend at knees, able to ambulate, epidural receding and no apparent nausea or vomiting Anesthetic complications: no    Last Vitals:  Vitals:   11/30/18 0138 11/30/18 0623  BP: 134/83 121/82  Pulse: 99 64  Resp: 18 18  Temp: 37.5 C 36.7 C  SpO2:      Last Pain:  Vitals:   11/30/18 0725  TempSrc:   PainSc: Asleep   Pain Goal:                 Ailene Ards

## 2018-11-30 NOTE — Progress Notes (Signed)
Mother requested Percocet. Upon scanning percocet, informed mother that she had had her maximum dose for the time and asked if she was ok waiting 30 minutes for ibuprofen. Mother stated ok with this. Documentation revised in [facility].

## 2018-12-01 MED ORDER — HYDROCORTISONE ACE-PRAMOXINE 1-1 % RE FOAM
1.0000 | Freq: Two times a day (BID) | RECTAL | Status: DC
  Administered 2018-12-01: 1 via RECTAL
  Filled 2018-12-01: qty 10

## 2018-12-01 MED ORDER — DOCUSATE SODIUM 100 MG PO CAPS: 100.0000 mg | ORAL_CAPSULE | Freq: Two times a day (BID) | ORAL | 0 refills | Status: DC

## 2018-12-01 MED ORDER — HYDROCORTISONE ACE-PRAMOXINE 1-1 % RE FOAM: 1.0000 | Freq: Two times a day (BID) | RECTAL | 0 refills | Status: DC

## 2018-12-01 MED ORDER — IBUPROFEN 600 MG PO TABS: 600.0000 mg | ORAL_TABLET | Freq: Four times a day (QID) | ORAL | 0 refills | Status: DC | PRN

## 2018-12-01 MED ORDER — OXYCODONE-ACETAMINOPHEN 5-325 MG PO TABS: 1.0000 | ORAL_TABLET | Freq: Four times a day (QID) | ORAL | 0 refills | Status: DC | PRN

## 2018-12-01 NOTE — Lactation Note (Addendum)
This note was copied from a baby's chart. Lactation Consultation Note  Patient Name: Patricia Cruz Today's Date: 12/01/2018  Prior to entering room nurse informed LC that infant last BF at 2220. When [facility] entered room Dad ask  LC to  leave please and not disturb them.    Maternal Data    Feeding    LATCH Score                   Interventions    Lactation Tools Discussed/Used     Consult Status      Vicente Serene 12/01/2018, 4:02 AM

## 2018-12-01 NOTE — Progress Notes (Signed)
Post Partum Day 2 Subjective: no complaints, up ad lib, voiding and tolerating PO  Objective: Blood pressure 131/68, pulse 90, temperature 98.7 F (37.1 C), temperature source Oral, resp. rate 16, height 5\' 7"  (1.702 m), weight 115.1 kg, last menstrual period 02/16/2018, SpO2 100 %, unknown if currently breastfeeding.  Physical Exam:  General: alert, cooperative and appears stated age Lochia: appropriate Uterine Fundus: firm DVT Evaluation: No evidence of DVT seen on physical exam.  Recent Labs    11/29/18 0138 11/30/18 0608  HGB 10.9* 10.1*  HCT 35.0* 31.7*    Assessment/Plan: Continue PP care Pt is breast and bottle feeding. There has been concern that the patient has not been adequately feeding baby due to lack of knowledge about frequency of feedings. Pt currently mainly bottle feeding due to difficulty with breast feeding. Baby may not be discharged home today. If baby remains a patient it would benefit the patient to remain inpatient for further education   LOS: 2 days   Vanessa Kick 12/01/2018, 9:39 AM

## 2018-12-01 NOTE — Progress Notes (Signed)
CSW acknowledged consult and completed a clinical assessment.  There are no barriers to d/c.  Clinical assessment notes will be entered at a later time.  Abundio Miu, [place] Worker [facility] Cell#: [phone]

## 2018-12-01 NOTE — Progress Notes (Signed)
Later entry. Had previous meeting amongst myself, FOB, Research scientist (medical), and Ron(Security) r/t staff concerns r/t FOB. At 0430, FOB approached and stated that his wife (MOB) was crying d/t meeting. I asked that he stay outside and I would go in alone and speak with MOB. MOB visibly upset, crying, and holding infant (Delta). When asked what was wrong, MOB stated that she is 1st time mom and wants to make sure she is doing everything right. MOB assured that she will be fine and to do the best she could and not expect to be perfect Mom. Discussed with Mom stories of Mom going to sleep and not feeding baby at scheduled time and other Mom "mistakes" that happen even if you have more than 1 child. MOB seemingly enjoyed conversation and was laughing. Told her that it is difficult now as only her colostrum has come in but the importance of fluid, rest, and pumping q 3 hours. Once supply is in, it is OK to allow FOB to feed EBM to baby. We discussed ways to allow FOB to participate in care of infant so Mom doesn't feel she has to do everything. MOB requested lactation to come in later during morning shift. MOB had no further questions and was smiling and appearing happy and thanked me.

## 2018-12-01 NOTE — Progress Notes (Signed)
CSW received consult for hx of Anxiety and Depression.  CSW met with MOB to offer support and complete assessment.    CSW met with MOB at bedside, FOB present. MOB granted CSW verbal permission to speak with her while FOB was present. CSW introduced self and explained reason for consult. CSW inquired about MOB's mental health history, MOB reported that she was diagnosed with anxiety in 2013 after losing her best friend in 2012 to a car accident. MOB reported that she was prescribed Prozac by her OB GYN provider, noting the medication was helpful. MOB reported that she stopped taking the medication when she found out she was pregnant. MOB reported that she has felt fine being off the medication and does not plan to restart it. MOB asked FOB how she did off the medication, FOB responded fantastic. MOB described her pregnancy as sometimes emotional attributing it to her hormones.  MOB reported that they tried for 9 months before getting pregnant and that she was excited throughout the pregnancy. CSW inquired about MOB's current emotional state, MOB reported that she felt happy and overwhelmed with the feedings. CSW normalized MOB's feelings. MOB presented happy and was engaged throughout the assessment. MOB did not demonstrate any acute mental health signs/symptoms. CSW assessed for safety, MOB denied SI and HI.   CSW provided education regarding the baby blues period vs. perinatal mood disorders, discussed treatment and gave resources for mental health follow up if concerns arise.  CSW recommends self-evaluation during the postpartum time period using the New Mom Checklist from Postpartum Progress and encouraged MOB to contact a medical professional if symptoms are noted at any time.    CSW identifies no further need for intervention and no barriers to discharge at this time.  Patricia Cruz, LCSWA Clinical Social Worker [facility] Cell#: [phone]  

## 2018-12-01 NOTE — Discharge Instructions (Signed)
Breastfeeding Choosing to breastfeed is one of the best decisions you can make for yourself and your baby. A change in hormones during pregnancy causes your breasts to make breast milk in your milk-producing glands. Hormones prevent breast milk from being released before your baby is born. They also prompt milk flow after birth. Once breastfeeding has begun, thoughts of your baby, as well as his or her sucking or crying, can stimulate the release of milk from your milk-producing glands. Benefits of breastfeeding Research shows that breastfeeding offers many health benefits for infants and mothers. It also offers a cost-free and convenient way to feed your baby. For your baby  Your first milk (colostrum) helps your baby's digestive system to function better.  Special cells in your milk (antibodies) help your baby to fight off infections.  Breastfed babies are less likely to develop asthma, allergies, obesity, or type 2 diabetes. They are also at lower risk for sudden infant death syndrome (SIDS).  Nutrients in breast milk are better able to meet your babys needs compared to infant formula.  Breast milk improves your baby's brain development. For you  Breastfeeding helps to create a very special bond between you and your baby.  Breastfeeding is convenient. Breast milk costs nothing and is always available at the correct temperature.  Breastfeeding helps to burn calories. It helps you to lose the weight that you gained during pregnancy.  Breastfeeding makes your uterus return faster to its size before pregnancy. It also slows bleeding (lochia) after you give birth.  Breastfeeding helps to lower your risk of developing type 2 diabetes, osteoporosis, rheumatoid arthritis, cardiovascular disease, and breast, ovarian, uterine, and endometrial cancer later in life. Breastfeeding basics Starting breastfeeding  Find a comfortable place to sit or lie down, with your neck and back  well-supported.  Place a pillow or a rolled-up blanket under your baby to bring him or her to the level of your breast (if you are seated). Nursing pillows are specially designed to help support your arms and your baby while you breastfeed.  Make sure that your baby's tummy (abdomen) is facing your abdomen.  Gently massage your breast. With your fingertips, massage from the outer edges of your breast inward toward the nipple. This encourages milk flow. If your milk flows slowly, you may need to continue this action during the feeding.  Support your breast with 4 fingers underneath and your thumb above your nipple (make the letter "C" with your hand). Make sure your fingers are well away from your nipple and your babys mouth.  Stroke your baby's lips gently with your finger or nipple.  When your baby's mouth is open wide enough, quickly bring your baby to your breast, placing your entire nipple and as much of the areola as possible into your baby's mouth. The areola is the colored area around your nipple. ? More areola should be visible above your baby's upper lip than below the lower lip. ? Your baby's lips should be opened and extended outward (flanged) to ensure an adequate, comfortable latch. ? Your baby's tongue should be between his or her lower gum and your breast.  Make sure that your baby's mouth is correctly positioned around your nipple (latched). Your baby's lips should create a seal on your breast and be turned out (everted).  It is common for your baby to suck about 2-3 minutes in order to start the flow of breast milk. Latching Teaching your baby how to latch onto your breast properly is very  important. An improper latch can cause nipple pain, decreased milk supply, and poor weight gain in your baby. Also, if your baby is not latched onto your nipple properly, he or she may swallow some air during feeding. This can make your baby fussy. Burping your baby when you switch breasts  during the feeding can help to get rid of the air. However, teaching your baby to latch on properly is still the best way to prevent fussiness from swallowing air while breastfeeding. Signs that your baby has successfully latched onto your nipple  Silent tugging or silent sucking, without causing you pain. Infant's lips should be extended outward (flanged).  Swallowing heard between every 3-4 sucks once your milk has started to flow (after your let-down milk reflex occurs).  Muscle movement above and in front of his or her ears while sucking.  Signs that your baby has not successfully latched onto your nipple  Sucking sounds or smacking sounds from your baby while breastfeeding.  Nipple pain.  If you think your baby has not latched on correctly, slip your finger into the corner of your babys mouth to break the suction and place it between your baby's gums. Attempt to start breastfeeding again. Signs of successful breastfeeding Signs from your baby  Your baby will gradually decrease the number of sucks or will completely stop sucking.  Your baby will fall asleep.  Your baby's body will relax.  Your baby will retain a small amount of milk in his or her mouth.  Your baby will let go of your breast by himself or herself.  Signs from you  Breasts that have increased in firmness, weight, and size 1-3 hours after feeding.  Breasts that are softer immediately after breastfeeding.  Increased milk volume, as well as a change in milk consistency and color by the fifth day of breastfeeding.  Nipples that are not sore, cracked, or bleeding.  Signs that your baby is getting enough milk  Wetting at least 1-2 diapers during the first 24 hours after birth.  Wetting at least 5-6 diapers every 24 hours for the first week after birth. The urine should be clear or pale yellow by the age of 5 days.  Wetting 6-8 diapers every 24 hours as your baby continues to grow and develop.  At least 3  stools in a 24-hour period by the age of 5 days. The stool should be soft and yellow.  At least 3 stools in a 24-hour period by the age of 7 days. The stool should be seedy and yellow.  No loss of weight greater than 10% of birth weight during the first 3 days of life.  Average weight gain of 4-7 oz (113-198 g) per week after the age of 4 days.  Consistent daily weight gain by the age of 5 days, without weight loss after the age of 2 weeks. After a feeding, your baby may spit up a small amount of milk. This is normal. Breastfeeding frequency and duration Frequent feeding will help you make more milk and can prevent sore nipples and extremely full breasts (breast engorgement). Breastfeed when you feel the need to reduce the fullness of your breasts or when your baby shows signs of hunger. This is called "breastfeeding on demand." Signs that your baby is hungry include:  Increased alertness, activity, or restlessness.  Movement of the head from side to side.  Opening of the mouth when the corner of the mouth or cheek is stroked (rooting).  Increased sucking sounds,  smacking lips, cooing, sighing, or squeaking.  Hand-to-mouth movements and sucking on fingers or hands.  Fussing or crying.  Avoid introducing a pacifier to your baby in the first 4-6 weeks after your baby is born. After this time, you may choose to use a pacifier. Research has shown that pacifier use during the first year of a baby's life decreases the risk of sudden infant death syndrome (SIDS). Allow your baby to feed on each breast as long as he or she wants. When your baby unlatches or falls asleep while feeding from the first breast, offer the second breast. Because newborns are often sleepy in the first few weeks of life, you may need to awaken your baby to get him or her to feed. Breastfeeding times will vary from baby to baby. However, the following rules can serve as a guide to help you make sure that your baby is  properly fed:  Newborns (babies 53 weeks of age or younger) may breastfeed every 1-3 hours.  Newborns should not go without breastfeeding for longer than 3 hours during the day or 5 hours during the night.  You should breastfeed your baby a minimum of 8 times in a 24-hour period.  Breast milk pumping Pumping and storing breast milk allows you to make sure that your baby is exclusively fed your breast milk, even at times when you are unable to breastfeed. This is especially important if you go back to work while you are still breastfeeding, or if you are not able to be present during feedings. Your lactation consultant can help you find a method of pumping that works best for you and give you guidelines about how long it is safe to store breast milk. Caring for your breasts while you breastfeed Nipples can become dry, cracked, and sore while breastfeeding. The following recommendations can help keep your breasts moisturized and healthy:  Avoid using soap on your nipples.  Wear a supportive bra designed especially for nursing. Avoid wearing underwire-style bras or extremely tight bras (sports bras).  Air-dry your nipples for 3-4 minutes after each feeding.  Use only cotton bra pads to absorb leaked breast milk. Leaking of breast milk between feedings is normal.  Use lanolin on your nipples after breastfeeding. Lanolin helps to maintain your skin's normal moisture barrier. Pure lanolin is not harmful (not toxic) to your baby. You may also hand express a few drops of breast milk and gently massage that milk into your nipples and allow the milk to air-dry.  In the first few weeks after giving birth, some women experience breast engorgement. Engorgement can make your breasts feel heavy, warm, and tender to the touch. Engorgement peaks within 3-5 days after you give birth. The following recommendations can help to ease engorgement:  Completely empty your breasts while breastfeeding or pumping. You  may want to start by applying warm, moist heat (in the shower or with warm, water-soaked hand towels) just before feeding or pumping. This increases circulation and helps the milk flow. If your baby does not completely empty your breasts while breastfeeding, pump any extra milk after he or she is finished.  Apply ice packs to your breasts immediately after breastfeeding or pumping, unless this is too uncomfortable for you. To do this: ? Put ice in a plastic bag. ? Place a towel between your skin and the bag. ? Leave the ice on for 20 minutes, 2-3 times a day.  Make sure that your baby is latched on and positioned properly while  breastfeeding.  If engorgement persists after 48 hours of following these recommendations, contact your health care provider or a Science writer. Overall health care recommendations while breastfeeding  Eat 3 healthy meals and 3 snacks every day. Well-nourished mothers who are breastfeeding need an additional 450-500 calories a day. You can meet this requirement by increasing the amount of a balanced diet that you eat.  Drink enough water to keep your urine pale yellow or clear.  Rest often, relax, and continue to take your prenatal vitamins to prevent fatigue, stress, and low vitamin and mineral levels in your body (nutrient deficiencies).  Do not use any products that contain nicotine or tobacco, such as cigarettes and e-cigarettes. Your baby may be harmed by chemicals from cigarettes that pass into breast milk and exposure to secondhand smoke. If you need help quitting, ask your health care provider.  Avoid alcohol.  Do not use illegal drugs or marijuana.  Talk with your health care provider before taking any medicines. These include over-the-counter and prescription medicines as well as vitamins and herbal supplements. Some medicines that may be harmful to your baby can pass through breast milk.  It is possible to become pregnant while breastfeeding. If  birth control is desired, ask your health care provider about options that will be safe while breastfeeding your baby. Where to find more information: [employer]: www.llli.org Contact a health care provider if:  You feel like you want to stop breastfeeding or have become frustrated with breastfeeding.  Your nipples are cracked or bleeding.  Your breasts are red, tender, or warm.  You have: ? Painful breasts or nipples. ? A swollen area on either breast. ? A fever or chills. ? Nausea or vomiting. ? Drainage other than breast milk from your nipples.  Your breasts do not become full before feedings by the fifth day after you give birth.  You feel sad and depressed.  Your baby is: ? Too sleepy to eat well. ? Having trouble sleeping. ? More than 52 week old and wetting fewer than 6 diapers in a 24-hour period. ? Not gaining weight by 78 days of age.  Your baby has fewer than 3 stools in a 24-hour period.  Your baby's skin or the white parts of his or her eyes become yellow. Get help right away if:  Your baby is overly tired (lethargic) and does not want to wake up and feed.  Your baby develops an unexplained fever. Summary  Breastfeeding offers many health benefits for infant and mothers.  Try to breastfeed your infant when he or she shows early signs of hunger.  Gently tickle or stroke your baby's lips with your finger or nipple to allow the baby to open his or her mouth. Bring the baby to your breast. Make sure that much of the areola is in your baby's mouth. Offer one side and burp the baby before you offer the other side.  Talk with your health care provider or lactation consultant if you have questions or you face problems as you breastfeed. This information is not intended to replace advice given to you by your health care provider. Make sure you discuss any questions you have with your health care provider. Document Released: 12/08/2005 Document  Revised: 01/09/2017 Document Reviewed: 01/09/2017 Elsevier Interactive Patient Education  2018 [place] Tips If you are breastfeeding, there may be times when you cannot feed your baby directly. Returning to work or going on a trip are  examples. Pumping allows you to store breast milk and feed it to your baby later. You may not get much milk when you first start to pump. Your breasts should start to make more after a few days. If you pump at the times you usually feed your baby, you may be able to keep making enough milk to feed your baby without also using formula. The more often you pump, the more milk your body will make. When should I pump?  You can start to pump soon after you have your baby. Ask your doctor what is right for you and your baby.  If you are going back to work, start pumping a few weeks before. This gives you time to learn how to pump and to store a supply of milk.  When you are with your baby, feed your baby when he or she is hungry. Pump after each feeding.  When you are away from your baby for many hours, pump for about 15 minutes every 2-3 hours. Pump both breasts at the same time if you can.  If your baby has a formula feeding, make sure to pump close to the same time.  If you drink any alcohol, wait 2 hours before pumping. How do I get ready to pump? Your let-down reflex is your body's natural reaction that makes your breast milk flow. It is easier to make your breast milk flow when you are relaxed. Try these things to help you relax:  Smell one of your baby's blankets or an item of clothing.  Look at a picture or video of your baby.  Sit in a quiet, private space.  Massage the breast you plan to pump.  Place soothing warmth on the breast.  Play relaxing music.  What are some breast pumping tips?  Wash your hands before you pump. You do not need to wash your nipples or breasts.  There are three ways to pump. You can: ? Use your  hand to massage and squeeze your breast. ? Use a handheld manual pump. ? Use an electric pump.  Make sure the suction cup on the breast pump is the right size. Place the suction cup directly over the nipple. It can be painful or hurt your nipple if it is the wrong size or placed wrong.  Put a small amount of purified or modified lanolin on your nipple and areola if you are sore.  If you are using an electric pump, change the speed and suction power to be more comfortable.  You may need a different type of pump if pumping hurts or you do not get a lot of milk. Your doctor can help you pick what type of pump to use.  Keep a full water bottle near you always. Drinking lots of fluid helps you make more milk.  You can store your milk to use later. Pumped breast milk can be stored in a sealable, sterile container or plastic bag. Always put the date you pumped it on the container. ? Milk can stay out at room temperature for up to 8 hours. ? You can store your milk in the refrigerator for up to 8 days. ? You can store your milk in the freezer for 3 months. Thaw frozen milk using warm water. Do not put it in the microwave.  Do not smoke. Ask your doctor for help. When should I call my doctor?  You have a hard time pumping.  You are worried you do not make enough milk.  You have nipple pain, soreness, or redness.  You want to take birth control pills. This information is not intended to replace advice given to you by your health care provider. Make sure you discuss any questions you have with your health care provider. Document Released: 05/26/2008 Document Revised: 05/15/2016 Document Reviewed: 09/30/2013 Elsevier Interactive Patient Education  2017 [employer].

## 2018-12-01 NOTE — Discharge Summary (Signed)
Obstetric Discharge Summary Reason for Admission: onset of labor Prenatal Procedures: NST and ultrasound Intrapartum Procedures: spontaneous vaginal delivery Postpartum Procedures: none Complications-Operative and Postpartum: none Hemoglobin  Date Value Ref Range Status  11/30/2018 10.1 (L) 12.0 - 15.0 g/dL Final   HCT  Date Value Ref Range Status  11/30/2018 31.7 (L) 36.0 - 46.0 % Final    Physical Exam:  General: alert, cooperative and appears stated age 32: appropriate Uterine Fundus: firm Incision: healing well DVT Evaluation: No evidence of DVT seen on physical exam.  Discharge Diagnoses: Term Pregnancy-delivered  Discharge Information: Date: 12/01/2018 Activity: pelvic rest Diet: routine Medications: Ibuprofen, Colace and Percocet Condition: improved Instructions: refer to practice specific booklet Discharge to: home Follow-up Information    Allyn Kenner, DO Follow up in 4 week(s).   Specialty:  Obstetrics and Gynecology Why:  for a postpartum evaluation Contact information: [address] [place] [address] [phone]           Newborn Data: Live born female  Birth Weight: 8 lb 11.3 oz (3950 g) APGAR: 8, 9  Newborn Delivery   Birth date/time:  11/29/2018 22:22:00 Delivery type:  Vaginal, Spontaneous     Home with mother.  Vanessa Kick 12/01/2018, 10:09 AM

## 2019-02-07 ENCOUNTER — Telehealth (HOSPITAL_COMMUNITY): Payer: Self-pay | Admitting: *Deleted

## 2019-07-23 DIAGNOSIS — Z8616 Personal history of COVID-19: Secondary | ICD-10-CM

## 2019-07-23 HISTORY — DX: Personal history of COVID-19: Z86.16

## 2020-02-08 DIAGNOSIS — E0590 Thyrotoxicosis, unspecified without thyrotoxic crisis or storm: Secondary | ICD-10-CM | POA: Insufficient documentation

## 2020-04-12 ENCOUNTER — Encounter (INDEPENDENT_AMBULATORY_CARE_PROVIDER_SITE_OTHER): Payer: Self-pay | Admitting: General Surgery

## 2020-04-12 ENCOUNTER — Ambulatory Visit: Payer: BC Managed Care – PPO | Admitting: Internal Medicine

## 2020-04-12 ENCOUNTER — Other Ambulatory Visit: Payer: Self-pay

## 2020-04-12 ENCOUNTER — Encounter: Payer: Self-pay | Admitting: Internal Medicine

## 2020-04-12 VITALS — BP 110/80 | HR 90 | Ht 67.5 in | Wt 200.0 lb

## 2020-04-12 DIAGNOSIS — E0590 Thyrotoxicosis, unspecified without thyrotoxic crisis or storm: Secondary | ICD-10-CM

## 2020-04-12 LAB — T3, FREE: T3, Free: 4.4 pg/mL — ABNORMAL HIGH (ref 2.3–4.2)

## 2020-04-12 LAB — T4, FREE: Free T4: 1.08 ng/dL (ref 0.60–1.60)

## 2020-04-12 LAB — TSH: TSH: <0.01 u[IU]/mL — ABNORMAL LOW (ref 0.35–4.50)

## 2020-04-12 NOTE — Patient Instructions (Signed)
Please stop at the lab.  Please come back for a follow-up appointment in 3 months.   Hyperthyroidism  Hyperthyroidism is when the thyroid gland is too active (overactive). The thyroid gland is a small gland located in the lower front part of the neck, just in front of the windpipe (trachea). This gland makes hormones that help control how the body uses food for energy (metabolism) as well as how the heart and brain function. These hormones also play a role in keeping your bones strong. When the thyroid is overactive, it produces too much of a hormone called thyroxine. What are the causes? This condition may be caused by:  Graves' disease. This is a disorder in which the body's disease-fighting system (immune system) attacks the thyroid gland. This is the most common cause.  Inflammation of the thyroid gland.  A tumor in the thyroid gland.  Use of certain medicines, including: ? Prescription thyroid hormone replacement. ? Herbal supplements that mimic thyroid hormones. ? Amiodarone therapy.  Solid or fluid-filled lumps within your thyroid gland (thyroid nodules).  Taking in a large amount of iodine from foods or medicines. What increases the risk? You are more likely to develop this condition if:  You are female.  You have a family history of thyroid conditions.  You smoke tobacco.  You use a medicine called lithium.  You take medicines that affect the immune system (immunosuppressants). What are the signs or symptoms? Symptoms of this condition include:  Nervousness.  Inability to tolerate heat.  Unexplained weight loss.  Diarrhea.  Change in the texture of hair or skin.  Heart skipping beats or making extra beats.  Rapid heart rate.  Loss of menstruation.  Shaky hands.  Fatigue.  Restlessness.  Sleep problems.  Enlarged thyroid gland or a lump in the thyroid (nodule). You may also have symptoms of Graves' disease, which may include:  Protruding  eyes.  Dry eyes.  Red or swollen eyes.  Problems with vision. How is this diagnosed? This condition may be diagnosed based on:  Your symptoms and medical history.  A physical exam.  Blood tests.  Thyroid ultrasound. This test involves using sound waves to produce images of the thyroid gland.  A thyroid scan. A radioactive substance is injected into a vein, and images show how much iodine is present in the thyroid.  Radioactive iodine uptake test (RAIU). A small amount of radioactive iodine is given by mouth to see how much iodine the thyroid absorbs after a certain amount of time. How is this treated? Treatment depends on the cause and severity of the condition. Treatment may include:  Medicines to reduce the amount of thyroid hormone your body makes.  Radioactive iodine treatment (radioiodine therapy). This involves swallowing a small dose of radioactive iodine, in capsule or liquid form, to kill thyroid cells.  Surgery to remove part or all of your thyroid gland. You may need to take thyroid hormone replacement medicine for the rest of your life after thyroid surgery.  Medicines to help manage your symptoms. Follow these instructions at home:   Take over-the-counter and prescription medicines only as told by your health care provider.  Do not use any products that contain nicotine or tobacco, such as cigarettes and e-cigarettes. If you need help quitting, ask your health care provider.  Follow any instructions from your health care provider about diet. You may be instructed to limit foods that contain iodine.  Keep all follow-up visits as told by your health care provider. This  is important. ? You will need to have blood tests regularly so that your health care provider can monitor your condition. Contact a health care provider if:  Your symptoms do not get better with treatment.  You have a fever.  You are taking thyroid hormone replacement medicine and you: ? Have  symptoms of depression. ? Feel like you are tired all the time. ? Gain weight. Get help right away if:  You have chest pain.  You have decreased alertness or a change in your awareness.  You have abdominal pain.  You feel dizzy.  You have a rapid heartbeat.  You have an irregular heartbeat.  You have difficulty breathing. Summary  The thyroid gland is a small gland located in the lower front part of the neck, just in front of the windpipe (trachea).  Hyperthyroidism is when the thyroid gland is too active (overactive) and produces too much of a hormone called thyroxine.  The most common cause is Graves' disease, a disorder in which your immune system attacks the thyroid gland.  Hyperthyroidism can cause various symptoms, such as unexplained weight loss, nervousness, inability to tolerate heat, or changes in your heartbeat.  Treatment may include medicine to reduce the amount of thyroid hormone your body makes, radioiodine therapy, surgery, or medicines to manage symptoms. This information is not intended to replace advice given to you by your health care provider. Make sure you discuss any questions you have with your health care provider. Document Revised: 11/20/2017 Document Reviewed: 11/18/2017 Elsevier Patient Education  2020 [employer].

## 2020-04-12 NOTE — Progress Notes (Signed)
Patient ID: Patricia Cruz, female   DOB: [birth year], 34 y.o.   MRN: [id number]   This visit occurred during the SARS-CoV-2 public health emergency.  Safety protocols were in place, including screening questions prior to the visit, additional usage of staff PPE, and extensive cleaning of exam room while observing appropriate contact time as indicated for disinfecting solutions.   HPI  Patricia Cruz is a 34 y.o.-year-old female, referred by her PCP, Dr. 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  Patient was found to have thyrotoxicosis during investigation for fatigue and during an APE in 01/2020.  She mentions that at the previous annual physical exams, she had normal thyroid tests, however, she did not have an APE in 2020.  She gave birth to her daughter in 11/2018.  After giving birth, she had more anxiety depression but she attributes this to being out of antidepressants for the period of pregnancy and up to 07/2019.  She started feeling better after she restarted them in 07/2019. She was also diagnosed with Covid 19 in 07/2019.  She continues to feel fatigued afterwards.  I reviewed pt's thyroid tests: 02/02/2020: TSH <0.005, free T3 6.2 (2-4.4) 01/27/2020: TSH <0.005 Previously normal. No results found for: TSH, FREET4, T3FREE  Antithyroid antibodies: No results found for: TSI  Pt denies: - feeling nodules in neck - hoarseness - dysphagia - choking - SOB with lying down  She mentions: - + Fatigue - + excessive sweating/heat intolerance - chronic - after pregnancy - no tremors - + anxiety, + depression - exacerbated after pregnancy  - + palpitations - + diarrhea - but has IBS - + weight loss, with losing pregnancy weight + 10 lbs - + hair loss postpartum >> improved  Pt does  have a FH of thyroid ds.: M with Graves ds., Father with hypothyroidism, MGM with hyperthyroidism and had thyroidectomy, MGGM - also thyroid ds. No FH of thyroid cancer. No h/o radiation tx to head or  neck.  No seaweed or kelp, no recent contrast studies. No steroid use. No herbal supplements. No Biotin use.  Of note, she had a normal white blood cell count on 01/27/2020: 4.4 (3.4-10.8) she also had normal LFTs at that time (will be scanned).  Pt. also has a history of ADD.  ROS: Constitutional: + see HPI Eyes: no blurry vision, no xerophthalmia ENT: no sore throat, + see HPI Cardiovascular: no CP/SOB/+ occasional palpitations/no leg swelling Respiratory: + Cough/no SOB Gastrointestinal: no N/V/+ D/no C/+ heartburn Musculoskeletal: no muscle/joint aches Skin: + Rash, + improved hair loss Neurological: no tremors/numbness/tingling/dizziness, + HAs Psychiatric: + Both depression/anxiety  Past Medical History:  Diagnosis Date  . ADHD   . Anxiety   . Atypical nevus 11/05/2012   Left Outer Hip Ant-Moderate, Lefet Outer Hip Post-Moderate and Supra Pubic-Mild   Past Surgical History:  Procedure Laterality Date  . TONSILLECTOMY AND ADENOIDECTOMY    . WISDOM TOOTH EXTRACTION     Social History   Socioeconomic History  . Marital status: Married    Spouse name: Not on file  . Number of children: 0  . Years of education: Not on file  . Highest education level: Not on file  Occupational History  .  Payroll manager  Tobacco Use  . Smoking status: Never Smoker  . Smokeless tobacco: Never Used  Substance and Sexual Activity  . Alcohol use:  Wine or beer 1-2 drinks weekly  . Drug use: Never  . Sexual activity: Not on file  Other Topics Concern  .  Not on file  Social History Narrative  . Not on file   Social Determinants of Health   Financial Resource Strain:   . Difficulty of Paying Living Expenses:   Food Insecurity:   . Worried About Charity fundraiser in the Last Year:   . Arboriculturist in the Last Year:   Transportation Needs:   . Film/video editor (Medical):   Marland Kitchen Lack of Transportation (Non-Medical):   Physical Activity:   . Days of Exercise per Week:   .  Minutes of Exercise per Session:   Stress:   . Feeling of Stress :   Social Connections:   . Frequency of Communication with Friends and Family:   . Frequency of Social Gatherings with Friends and Family:   . Attends Religious Services:   . Active Member of Clubs or Organizations:   . Attends Archivist Meetings:   Marland Kitchen Marital Status:   Intimate Partner Violence:   . Fear of Current or Ex-Partner:   . Emotionally Abused:   Marland Kitchen Physically Abused:   . Sexually Abused:    Current Outpatient Medications on File Prior to Visit  Medication Sig Dispense Refill  . oxyCODONE-acetaminophen (PERCOCET/ROXICET) 5-325 MG tablet Take 1-2 tablets by mouth every 6 (six) hours as needed for severe pain. 10 tablet 0   No current facility-administered medications on file prior to visit.   No Known Allergies   No family history on file.  PE: BP 110/80   Pulse 90   Ht 5' 7.5" (1.715 m)   Wt 200 lb (90.7 kg)   LMP 03/21/2020   SpO2 99%   BMI 30.86 kg/m  Wt Readings from Last 3 Encounters:  04/12/20 200 lb (90.7 kg)  11/29/18 253 lb 12.8 oz (115.1 kg)  10/18/18 240 lb (108.9 kg)   Constitutional: overweight, in NAD Eyes: PERRLA, EOMI, no exophthalmos, no lid lag, no stare ENT: moist mucous membranes, no thyromegaly, no thyroid bruits, no cervical lymphadenopathy Cardiovascular: RRR, No MRG Respiratory: CTA B Gastrointestinal: abdomen soft, NT, ND, BS+ Musculoskeletal: no deformities, strength intact in all 4 Skin: moist, warm, no rashes Neurological: + Very fine tremor with outstretched hands, DTR normal in all 4  ASSESSMENT: 1. Thyrotoxicosis  PLAN:  1. Patient with a recently found low TSH, with possible thyrotoxic sxs: weight loss, heat intolerance, hyperdefecation, palpitations, anxiety, tremor (however she tells me she felt nervous about this appointment).  - she does not appear to have exogenous causes for the low TSH.  - We discussed that possible causes of thyrotoxicosis  are:  Marland Kitchen Graves ds (she does have a family history of this) . Thyroiditis (possibly developed after pregnancy, but it would be an unusual long course; also possibly developed after COVID-19) . toxic multinodular goiter/ toxic adenoma (I cannot feel nodules at palpation of her thyroid). - will check the TSH, fT3 and fT4 and also add thyroid stimulating antibodies to screen for Graves' disease.  - If the tests remain abnormal, we may need an uptake and scan to differentiate between the 3 above possible etiologies  - we discussed about possible modalities of treatment for the above conditions, to include methimazole use, radioactive iodine ablation or (last resort) surgery. - Pt tells me that she and her husband would like to get pregnant again.  In this case, except for thyroiditis, definitive treatment is indicated with RAI treatment.  However, after discussion about benefits and risks, she is reticent to have this treatment due to  the risk of lifelong hypothyroidism.  We decided to try methimazole for approximately 1 month and see how she responds to it.  If she requires a very low dose of methimazole, she may be able to come off thionamides during the pregnancy.  I did explain that during first semester pregnancy, PTU is preferred.  We did discuss about possible teratogenic effects of PTU or methimazole. - I do not feel that we need to add beta blockers at this time, since she is not tachycardic at the end of the exam - no signs of Graves' ophthalmopathy: she does not have any double vision, blurry vision, eye pain, chemosis. - RTC in 3 months, but likely sooner for repeat labs  Component     Latest Ref Rng & Units 04/12/2020  TSH     0.35 - 4.50 uIU/mL <0.01 (L)  T4,Free(Direct)     0.60 - 1.60 ng/dL 1.08  Triiodothyronine,Free,Serum     2.3 - 4.2 pg/mL 4.4 (H)   Addendum: TSI antibodies are not back yet (04/30/2020).  We will call the lab to check on the status.  Until then, I will advise her  to start methimazole 5 mg daily and repeat labs in 1.5 months.  Philemon Kingdom, MD PhD [facility]

## 2020-04-23 ENCOUNTER — Encounter: Payer: Self-pay | Admitting: Internal Medicine

## 2020-04-30 ENCOUNTER — Other Ambulatory Visit: Payer: Self-pay

## 2020-04-30 ENCOUNTER — Encounter: Payer: Self-pay | Admitting: Internal Medicine

## 2020-04-30 MED ORDER — METHIMAZOLE 5 MG PO TABS: 5.0000 mg | ORAL_TABLET | Freq: Every day | ORAL | 5 refills | Status: DC

## 2020-05-01 ENCOUNTER — Encounter: Payer: Self-pay | Admitting: Internal Medicine

## 2020-05-02 LAB — THYROID STIMULATING IMMUNOGLOBULIN: TSI: 157 % baseline — ABNORMAL HIGH (ref <140)

## 2020-06-05 ENCOUNTER — Other Ambulatory Visit (INDEPENDENT_AMBULATORY_CARE_PROVIDER_SITE_OTHER): Payer: BC Managed Care – PPO

## 2020-06-05 ENCOUNTER — Other Ambulatory Visit: Payer: Self-pay

## 2020-06-05 DIAGNOSIS — E0590 Thyrotoxicosis, unspecified without thyrotoxic crisis or storm: Secondary | ICD-10-CM | POA: Diagnosis not present

## 2020-06-05 LAB — TSH: TSH: 0.3 u[IU]/mL — ABNORMAL LOW (ref 0.35–4.50)

## 2020-06-05 LAB — T3, FREE: T3, Free: 3.5 pg/mL (ref 2.3–4.2)

## 2020-06-05 LAB — T4, FREE: Free T4: 0.59 ng/dL — ABNORMAL LOW (ref 0.60–1.60)

## 2020-07-10 ENCOUNTER — Ambulatory Visit: Payer: BC Managed Care – PPO | Admitting: Internal Medicine

## 2020-07-10 ENCOUNTER — Encounter: Payer: Self-pay | Admitting: Internal Medicine

## 2020-09-07 ENCOUNTER — Ambulatory Visit: Payer: Self-pay | Admitting: Internal Medicine

## 2020-10-15 ENCOUNTER — Encounter: Payer: Self-pay | Admitting: Internal Medicine

## 2020-10-16 ENCOUNTER — Ambulatory Visit: Payer: BC Managed Care – PPO | Admitting: Internal Medicine

## 2020-10-16 ENCOUNTER — Encounter: Payer: Self-pay | Admitting: Internal Medicine

## 2020-10-16 ENCOUNTER — Other Ambulatory Visit: Payer: Self-pay

## 2020-10-16 VITALS — BP 124/84 | HR 98 | Ht 67.5 in | Wt 204.0 lb

## 2020-10-16 DIAGNOSIS — E0500 Thyrotoxicosis with diffuse goiter without thyrotoxic crisis or storm: Secondary | ICD-10-CM

## 2020-10-16 LAB — T4, FREE: Free T4: 0.78 ng/dL (ref 0.60–1.60)

## 2020-10-16 LAB — T3, FREE: T3, Free: 3.8 pg/mL (ref 2.3–4.2)

## 2020-10-16 LAB — TSH: TSH: 2.39 u[IU]/mL (ref 0.35–4.50)

## 2020-10-16 NOTE — Patient Instructions (Signed)
Please stop at the lab.  Please continue Methimazole 5 mg daily.  Please come back for a follow-up appointment in 6 months.

## 2020-10-16 NOTE — Progress Notes (Signed)
Patient ID: Patricia Cruz, female   DOB: [birth year], 34 y.o.   MRN: [id number]   This visit occurred during the SARS-CoV-2 public health emergency.  Safety protocols were in place, including screening questions prior to the visit, additional usage of staff PPE, and extensive cleaning of exam room while observing appropriate contact time as indicated for disinfecting solutions.   HPI  Patricia Cruz is a 34 y.o.-year-old female, initially referred by her PCP, Dr. Manuella Ghazi, returning for follow-up for Graves' disease.  Last visit 5 months ago.  Reviewed and addended history: Patient was found to have thyrotoxicosis during investigation for fatigue and during an APE in 01/2020.  She mentions that at the previous annual physical exams, she had normal thyroid tests, however, she did not have an APE in 2020.  She gave birth to her daughter in 11/2018.  After giving birth, she had more anxiety depression but she attributes this to being out of antidepressants for the period of pregnancy and up to 07/2019.  She started feeling better after she restarted them in 07/2019.  She was also diagnosed with Covid 19 in 07/2019.  She continues to feel fatigued afterwards.  At last visit, her thyroid tests are still abnormal and her Graves' antibodies were elevated.    We started her on methimazole 5 mg daily.  TFTs improved afterwards.  Reviewed patient's TFTs: Lab Results  Component Value Date   TSH 0.30 (L) 06/05/2020   TSH <0.01 (L) 04/12/2020   FREET4 0.59 (L) 06/05/2020   FREET4 1.08 04/12/2020   T3FREE 3.5 06/05/2020   T3FREE 4.4 (H) 04/12/2020  02/02/2020: TSH <0.005, free T3 6.2 (2-4.4) 01/27/2020: TSH <0.005  Her TSI antibodies were elevated: Lab Results  Component Value Date   TSI 157 (H) 04/12/2020   Pt denies: - feeling nodules in neck - hoarseness - dysphagia - choking - SOB with lying down  At last visit, she was mentioning: - + Fatigue - + excessive sweating/heat intolerance - chronic -  after pregnancy - no tremors - + anxiety, + depression - exacerbated after pregnancy  - + palpitations - + diarrhea - but has IBS - + weight loss, with losing pregnancy weight + 10 lbs - + hair loss postpartum >> improved  At this visit: -The above symptoms have improved significantly - but still has hot flushes; palpitations (but on med for ADD)  Pt does  have a FH of thyroid ds.: M with Graves ds., Father with hypothyroidism, MGM with hyperthyroidism and had thyroidectomy, MGGM - also thyroid ds. No FH of thyroid cancer. No h/o radiation tx to head or neck.  No seaweed or kelp. No recent contrast studies. No herbal supplements. No Biotin use. No recent steroids use.   Of note, she had a normal white blood cell count on 01/27/2020: 4.4 (3.4-10.8) she also had normal LFTs at that time (will be scanned).  Pt. also has a history of ADD - on Adderall.  ROS: Constitutional: no weight gain/no weight loss, no fatigue, + subjective hyperthermia, no subjective hypothermia Eyes: no blurry vision, no xerophthalmia ENT: no sore throat, + see HPI Cardiovascular: no CP/no SOB/+ occasional palpitations/no leg swelling Respiratory: no cough/no SOB/no wheezing Gastrointestinal: no N/no V/no D/no C/+ acid reflux Musculoskeletal: no muscle aches/no joint aches Skin: no rashes, no hair loss Neurological: no tremors/no numbness/no tingling/no dizziness  I reviewed pt's medications, allergies, PMH, social hx, family hx, and changes were documented in the history of present illness. Otherwise, unchanged from my initial visit  note.  Past Medical History:  Diagnosis Date   ADHD    Anxiety    Atypical nevus 11/05/2012   Left Outer Hip Ant-Moderate, Lefet Outer Hip Post-Moderate and Supra Pubic-Mild   Hemorrhoids    Migraine headache    Rectal bleeding    Past Surgical History:  Procedure Laterality Date   TONSILLECTOMY  2006   TONSILLECTOMY AND ADENOIDECTOMY     WISDOM TOOTH EXTRACTION   2006   WISDOM TOOTH EXTRACTION     Social History   Socioeconomic History   Marital status: Married    Spouse name: Not on file   Number of children: 0   Years of education: Not on file   Highest education level: Not on file  Occupational History    Market researcher  Tobacco Use   Smoking status: Never Smoker   Smokeless tobacco: Never Used  Substance and Sexual Activity   Alcohol use:  Wine or beer 1-2 drinks weekly   Drug use: Never   Sexual activity: Not on file  Other Topics Concern   Not on file  Social History Narrative   Not on file   Social Determinants of Health   Financial Resource Strain:    Difficulty of Paying Living Expenses:   Food Insecurity:    Worried About Charity fundraiser in the Last Year:    Arboriculturist in the Last Year:   Transportation Needs:    Film/video editor (Medical):    Lack of Transportation (Non-Medical):   Physical Activity:    Days of Exercise per Week:    Minutes of Exercise per Session:   Stress:    Feeling of Stress :   Social Connections:    Frequency of Communication with Friends and Family:    Frequency of Social Gatherings with Friends and Family:    Attends Religious Services:    Active Member of Clubs or Organizations:    Attends Music therapist:    Marital Status:   Intimate Partner Violence:    Fear of Current or Ex-Partner:    Emotionally Abused:    Physically Abused:    Sexually Abused:    Current Outpatient Medications on File Prior to Visit  Medication Sig Dispense Refill   ADDERALL XR 10 MG 24 hr capsule Take 10 mg by mouth in the morning and at bedtime.     docusate sodium (COLACE) 100 MG capsule Take 1 capsule (100 mg total) by mouth 2 (two) times daily. 60 capsule 2   FLUoxetine (PROZAC) 20 MG capsule 20 mg.     FLUoxetine (PROZAC) 20 MG tablet Take 20 mg by mouth daily.     methimazole (TAPAZOLE) 5 MG tablet Take 1 tablet (5 mg total) by mouth  daily. With a meal. 45 tablet 5   Norethin Ace-Eth Estrad-FE (LOESTRIN 24 FE PO) Take by mouth.     Norethindrone Acetate-Ethinyl Estrad-FE (BLISOVI 24 FE) 1-20 MG-MCG(24) tablet Blisovi 24 Fe 1 mg-20 mcg (24)/75 mg (4) tablet  TAKE 1 TABLET BY MOUTH EVERY DAY     topiramate (TOPAMAX) 100 MG tablet Take 100 mg by mouth 2 (two) times daily.     No current facility-administered medications on file prior to visit.   Allergies  Allergen Reactions   Thimerosal      No family history on file.  PE: BP 124/84    Pulse 98    Ht 5' 7.5" (1.715 m)    Wt 204 lb (92.5 kg)  SpO2 98%    BMI 31.48 kg/m  Wt Readings from Last 3 Encounters:  10/16/20 204 lb (92.5 kg)  04/12/20 200 lb (90.7 kg)  11/29/18 253 lb 12.8 oz (115.1 kg)   Constitutional: overweight, in NAD Eyes: PERRLA, EOMI, no exophthalmos, no lid lag, no stare ENT: moist mucous membranes, no thyromegaly, no thyroid bruits, no cervical lymphadenopathy Cardiovascular: + Tachycardia, RR, No MRG Respiratory: CTA B Gastrointestinal: abdomen soft, NT, ND, BS+ Musculoskeletal: no deformities, strength intact in all 4 Skin: moist, warm, no rashes Neurological: no tremor with outstretched hands, DTR normal in all 4  ASSESSMENT: 1.  Graves' disease -Diagnosed after last visit  PLAN:  1. Patient with a history of low TSH, with possible thyroid symptoms: Weight loss, heat intolerance, hyper defecation, palpitations, anxiety, tremors. She continues to have heat intolerance and palpitations but the rest of the sxs resolved. -We diagnosed Graves' disease at last visit on the basis of her history, elevated TSI antibodies and family history of this disease, so we skipped the thyroid uptake and scan -At last visit (again today) we discussed about possible modalities of treatment of the above conditions, to include methimazole, RAI treatment, or, last resort, surgery.  At last visit I did suggest RAI treatment especially since she was planning  another pregnancy but, after a discussion about risks and benefits, we decided to first try methimazole and see how she does.  She is now on 5 mg of methimazole daily.  We discussed at last visit and again today that if she gets pregnant, in the first trimester of pregnancy, PTU is preferred.  From the second trimester on, we can switch back to methimazole. However, at this visit, she tells me that she is not sure if she would like to have another baby (daughter is almost 2 y/o). -She is usually tachycardic at the beginning of the exam due to anxiety caused by doctors appointments.  Usually, she is not tachycardic at the end of the visit. [place], per her apple watch, her HR at rest is averaging 89. - No signs of Graves' ophthalmopathy: No blurry vision, double vision, eye pain, chemosis - At today's visit we will recheck her TFTs and adjust the dose of methimazole accordingly - I we will see her back in 6 months but possibly sooner for labs  Needs refills.  Component     Latest Ref Rng & Units 10/16/2020  TSH     0.35 - 4.50 uIU/mL 2.39  T4,Free(Direct)     0.60 - 1.60 ng/dL 0.78  Triiodothyronine,Free,Serum     2.3 - 4.2 pg/mL 3.8   Thyroid tests are all normal.  We can reduce the dose of methimazole to 2.5 mg daily and recheck her test in 1.5 months.  Philemon Kingdom, MD PhD [facility]

## 2020-10-17 ENCOUNTER — Encounter: Payer: Self-pay | Admitting: Internal Medicine

## 2020-10-17 MED ORDER — METHIMAZOLE 5 MG PO TABS
2.5000 mg | ORAL_TABLET | Freq: Every day | ORAL | 3 refills | Status: DC
Start: 2020-10-17 — End: 2021-06-03

## 2020-12-05 ENCOUNTER — Other Ambulatory Visit: Payer: BC Managed Care – PPO

## 2020-12-26 ENCOUNTER — Other Ambulatory Visit: Payer: BC Managed Care – PPO

## 2021-01-02 ENCOUNTER — Other Ambulatory Visit: Payer: BC Managed Care – PPO

## 2021-01-02 ENCOUNTER — Encounter: Payer: Self-pay | Admitting: Internal Medicine

## 2021-01-23 ENCOUNTER — Other Ambulatory Visit: Payer: Self-pay

## 2021-01-23 ENCOUNTER — Other Ambulatory Visit (INDEPENDENT_AMBULATORY_CARE_PROVIDER_SITE_OTHER): Payer: BC Managed Care – PPO

## 2021-01-23 DIAGNOSIS — E0500 Thyrotoxicosis with diffuse goiter without thyrotoxic crisis or storm: Secondary | ICD-10-CM

## 2021-01-23 LAB — T3, FREE: T3, Free: 3.9 pg/mL (ref 2.3–4.2)

## 2021-01-23 LAB — TSH: TSH: 1.47 u[IU]/mL (ref 0.35–4.50)

## 2021-01-23 LAB — T4, FREE: Free T4: 0.79 ng/dL (ref 0.60–1.60)

## 2021-02-14 ENCOUNTER — Other Ambulatory Visit: Payer: Self-pay | Admitting: Obstetrics and Gynecology

## 2021-02-14 DIAGNOSIS — N83202 Unspecified ovarian cyst, left side: Secondary | ICD-10-CM

## 2021-02-27 ENCOUNTER — Ambulatory Visit
Admission: RE | Admit: 2021-02-27 | Discharge: 2021-02-27 | Disposition: A | Discharge status: Home / Self Care | Payer: BC Managed Care – PPO | Source: Ambulatory Visit | Attending: Obstetrics and Gynecology | Admitting: Obstetrics and Gynecology

## 2021-02-27 DIAGNOSIS — N83202 Unspecified ovarian cyst, left side: Secondary | ICD-10-CM

## 2021-03-01 ENCOUNTER — Other Ambulatory Visit: Payer: Self-pay | Admitting: Obstetrics and Gynecology

## 2021-03-01 DIAGNOSIS — N83202 Unspecified ovarian cyst, left side: Secondary | ICD-10-CM

## 2021-03-22 ENCOUNTER — Other Ambulatory Visit: Payer: Self-pay

## 2021-03-22 ENCOUNTER — Ambulatory Visit
Admission: RE | Admit: 2021-03-22 | Discharge: 2021-03-22 | Disposition: A | Discharge status: Home / Self Care | Payer: BC Managed Care – PPO | Source: Ambulatory Visit | Attending: Obstetrics and Gynecology | Admitting: Obstetrics and Gynecology

## 2021-03-22 DIAGNOSIS — N83202 Unspecified ovarian cyst, left side: Secondary | ICD-10-CM

## 2021-03-22 MED ORDER — GADOBENATE DIMEGLUMINE 529 MG/ML IV SOLN
20.0000 mL | Freq: Once | INTRAVENOUS | Status: AC | PRN
  Administered 2021-03-22: 20 mL via INTRAVENOUS

## 2021-03-27 ENCOUNTER — Other Ambulatory Visit: Payer: BC Managed Care – PPO

## 2021-04-16 ENCOUNTER — Ambulatory Visit: Payer: BC Managed Care – PPO | Admitting: Internal Medicine

## 2021-04-16 NOTE — Progress Notes (Deleted)
Patient ID: Patricia Cruz, female   DOB: [birth year], 35 y.o.   MRN: [id number]   This visit occurred during the SARS-CoV-2 public health emergency.  Safety protocols were in place, including screening questions prior to the visit, additional usage of staff PPE, and extensive cleaning of exam room while observing appropriate contact time as indicated for disinfecting solutions.   HPI  Patricia Cruz is a 35 y.o.-year-old female, initially referred by her PCP, Dr. Manuella Ghazi, returning for follow-up for Graves' disease.  Last visit 6 months ago.  Interim history:  Reviewed and addended history: Patient was found to have thyrotoxicosis during investigation for fatigue and during an APE in 01/2020.  She mentions that at the previous annual physical exams, she had normal thyroid tests, however, she did not have an APE in 2020.  She gave birth to her daughter in 11/2018.  After giving birth, she had more anxiety depression but she attributes this to being out of antidepressants for the period of pregnancy and up to 07/2019.  She started feeling better after she restarted them in 07/2019.  She was also diagnosed with Covid 19 in 07/2019.  She continues to feel fatigued afterwards.  At last visit, her thyroid tests are still abnormal and her Graves' antibodies were elevated.    We started her on methimazole 5 mg daily.  TFTs improved afterwards.  In 09/2020, we increased her methimazole dose to 2.5 mg daily  Reviewed patient's TFTs: Lab Results  Component Value Date   TSH 1.47 01/23/2021   TSH 2.39 10/16/2020   TSH 0.30 (L) 06/05/2020   TSH <0.01 (L) 04/12/2020   FREET4 0.79 01/23/2021   FREET4 0.78 10/16/2020   FREET4 0.59 (L) 06/05/2020   FREET4 1.08 04/12/2020   T3FREE 3.9 01/23/2021   T3FREE 3.8 10/16/2020   T3FREE 3.5 06/05/2020   T3FREE 4.4 (H) 04/12/2020  02/02/2020: TSH <0.005, free T3 6.2 (2-4.4) 01/27/2020: TSH <0.005  Her TSI antibodies were elevated: Lab Results  Component Value Date    TSI 157 (H) 04/12/2020   Pt denies: - feeling nodules in neck - hoarseness - dysphagia - choking - SOB with lying down  At last visit, she was mentioning: - + Fatigue - + excessive sweating/heat intolerance - chronic - after pregnancy - no tremors - + anxiety, + depression - exacerbated after pregnancy  - + palpitations - + diarrhea - but has IBS - + weight loss, with losing pregnancy weight + 10 lbs - + hair loss postpartum >> improved  At last visit and again today: -The above symptoms have improved significantly - but still has hot flushes; palpitations (but on med for ADD)  Pt does  have a FH of thyroid ds.: M with Graves ds., Father with hypothyroidism, MGM with hyperthyroidism and had thyroidectomy, MGGM - also thyroid ds. No FH of thyroid cancer. No h/o radiation tx to head or neck.  No seaweed or kelp. No recent contrast studies. No herbal supplements. No Biotin use. No recent steroids use.   Of note, she had a normal white blood cell count on 01/27/2020: 4.4 (3.4-10.8) she also had normal LFTs at that time.  Pt. also has a history of ADD - on Adderall.  ROS: Constitutional: no weight gain/no weight loss, no fatigue, + subjective hyperthermia, no subjective hypothermia Eyes: no blurry vision, no xerophthalmia ENT: no sore throat, + see HPI Cardiovascular: no CP/no SOB/+ occasional palpitations/no leg swelling Respiratory: no cough/no SOB/no wheezing Gastrointestinal: no N/no V/no D/no C/+  acid reflux Musculoskeletal: no muscle aches/no joint aches Skin: no rashes, no hair loss Neurological: no tremors/no numbness/no tingling/no dizziness  I reviewed pt's medications, allergies, PMH, social hx, family hx, and changes were documented in the history of present illness. Otherwise, unchanged from my initial visit note.  Past Medical History:  Diagnosis Date  . ADHD   . Anxiety   . 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 Hemorrhoids   . Migraine headache   . Rectal bleeding    Past Surgical History:  Procedure Laterality Date  . TONSILLECTOMY  2006  . TONSILLECTOMY AND ADENOIDECTOMY    . WISDOM TOOTH EXTRACTION  2006  . WISDOM TOOTH EXTRACTION     Social History   Socioeconomic History  . Marital status: Married    Spouse name: Not on file  . Number of children: 0  . Years of education: Not on file  . Highest education level: Not on file  Occupational History  .  Payroll manager  Tobacco Use  . Smoking status: Never Smoker  . Smokeless tobacco: Never Used  Substance and Sexual Activity  . Alcohol use:  Wine or beer 1-2 drinks weekly  . Drug use: Never  . Sexual activity: Not on file  Other Topics Concern  . Not on file  Social History Narrative  . Not on file   Social Determinants of Health   Financial Resource Strain:   . Difficulty of Paying Living Expenses:   Food Insecurity:   . Worried About Charity fundraiser in the Last Year:   . Arboriculturist in the Last Year:   Transportation Needs:   . Film/video editor (Medical):   Marland Kitchen Lack of Transportation (Non-Medical):   Physical Activity:   . Days of Exercise per Week:   . Minutes of Exercise per Session:   Stress:   . Feeling of Stress :   Social Connections:   . Frequency of Communication with Friends and Family:   . Frequency of Social Gatherings with Friends and Family:   . Attends Religious Services:   . Active Member of Clubs or Organizations:   . Attends Archivist Meetings:   Marland Kitchen Marital Status:   Intimate Partner Violence:   . Fear of Current or Ex-Partner:   . Emotionally Abused:   Marland Kitchen Physically Abused:   . Sexually Abused:    Current Outpatient Medications on File Prior to Visit  Medication Sig Dispense Refill  . ADDERALL XR 10 MG 24 hr capsule Take 10 mg by mouth in the morning and at bedtime.    Marland Kitchen FLUoxetine (PROZAC) 20 MG tablet Take 20 mg by mouth daily.     . methimazole (TAPAZOLE) 5 MG tablet Take 0.5 tablets (2.5 mg total) by mouth daily. With a meal. 45 tablet 3  . Norethindrone Acetate-Ethinyl Estrad-FE (BLISOVI 24 FE) 1-20 MG-MCG(24) tablet Blisovi 24 Fe 1 mg-20 mcg (24)/75 mg (4) tablet  TAKE 1 TABLET BY MOUTH EVERY DAY     No current facility-administered medications on file prior to visit.   Allergies  Allergen Reactions  . Thimerosal      No family history on file.  PE: There were no vitals taken for this visit. Wt Readings from Last 3 Encounters:  10/16/20 204 lb (92.5 kg)  04/12/20 200 lb (90.7 kg)  11/29/18 253 lb 12.8 oz (115.1 kg)   Constitutional: overweight, in NAD Eyes: PERRLA,  EOMI, no exophthalmos, no lid lag, no stare ENT: moist mucous membranes, no thyromegaly,  no cervical lymphadenopathy Cardiovascular: + Tachycardia, RR, No MRG Respiratory: CTA B Gastrointestinal: abdomen soft, NT, ND, BS+ Musculoskeletal: no deformities, strength intact in all 4 Skin: moist, warm, no rashes Neurological: no tremor with outstretched hands, DTR normal in all 4  ASSESSMENT: 1.  Graves' disease -Diagnosed after last visit  PLAN:  1. Patient withHistory of low TSH with possible thyrotoxic symptoms: Weight loss, heat intolerance, hyper defecation, palpitations, anxiety, tremors.  She continues to have heat intolerance and palpitations, but the rest of the symptoms resolved after starting methimazole. -We diagnosed Graves' disease on the basis of history, elevated TSI antibodies and family history of the disease, without needing a thyroid uptake and scan -At previous visits, we discussed about possible modalities of treatment for Graves' disease to include methimazole, RAI treatment, or, last resort, surgery.  I did suggest RAI treatment, especially since she was planning another pregnancy, but after discussion about risks and benefits, we decided to first try methimazole and see how she does.  She was on 5 mg of methimazole,  which we decreased at last visit to 2.5 mg daily.  We did discuss that if she got pregnant, in the first trimester of pregnancy, PTU is preferred while from the second trimester arm, we can switch back to methimazole. -However, at last visit, she was telling me that she was not sure if she wanted to have another baby.  She does have a 31-year-old daughter. -She is usually tachycardic at the beginning of the exam due to anxiety caused by doctors appointments.  Per her apple watch, her heart rate is not high at home. -No signs of Graves' ophthalmopathy: No blurry vision, double vision, eye pain, chemosis. -She did not feel different after decreasing the dose of methimazole at last visit and her TFTs were normal on 01/23/2021. -We will recheck her tests today and adjust the methimazole dose accordingly.  We may be able to stop the medication completely -I will see her back in 6 months  Philemon Kingdom, MD PhD [facility]

## 2021-05-02 ENCOUNTER — Ambulatory Visit: Payer: BC Managed Care – PPO | Admitting: Internal Medicine

## 2021-05-07 ENCOUNTER — Other Ambulatory Visit: Payer: Self-pay

## 2021-05-07 ENCOUNTER — Encounter (HOSPITAL_BASED_OUTPATIENT_CLINIC_OR_DEPARTMENT_OTHER): Payer: Self-pay | Admitting: Obstetrics and Gynecology

## 2021-05-07 NOTE — Progress Notes (Signed)
Spoke w/ via phone for pre-op interview--- PT Lab needs dos-- CBC, Urine preg, T&S             Lab results------ no COVID test -----patient states asymptomatic no test needed Arrive at -------  1045 on 05-10-2021 NPO after MN NO Solid Food.  Clear liquids from MN until--- 0945 Med rec completed Medications to take morning of surgery ----- Prozac Diabetic medication ----- n/a Patient instructed to bring photo id and insurance card day of surgery Patient aware to have Driver (ride ) / caregiver    for 24 hours after surgery -- husband, Rodman Key Patient Special Instructions ----- n/a Pre-Op special Istructions ----- n/a Patient verbalized understanding of instructions that were given at this phone interview. Patient denies shortness of breath, chest pain, fever, cough at this phone interview.

## 2021-05-10 ENCOUNTER — Ambulatory Visit (HOSPITAL_BASED_OUTPATIENT_CLINIC_OR_DEPARTMENT_OTHER): Payer: BC Managed Care – PPO | Admitting: Anesthesiology

## 2021-05-10 ENCOUNTER — Encounter (HOSPITAL_BASED_OUTPATIENT_CLINIC_OR_DEPARTMENT_OTHER)
Admission: RE | Disposition: A | Discharge status: Home / Self Care | Payer: Self-pay | Source: Home / Self Care | Attending: Obstetrics and Gynecology

## 2021-05-10 ENCOUNTER — Encounter (HOSPITAL_BASED_OUTPATIENT_CLINIC_OR_DEPARTMENT_OTHER): Payer: Self-pay | Admitting: Obstetrics and Gynecology

## 2021-05-10 ENCOUNTER — Ambulatory Visit (HOSPITAL_BASED_OUTPATIENT_CLINIC_OR_DEPARTMENT_OTHER)
Admission: RE | Admit: 2021-05-10 | Discharge: 2021-05-10 | Disposition: A | Discharge status: Home / Self Care | Payer: BC Managed Care – PPO | Attending: Obstetrics and Gynecology | Admitting: Obstetrics and Gynecology

## 2021-05-10 ENCOUNTER — Other Ambulatory Visit: Payer: Self-pay

## 2021-05-10 DIAGNOSIS — Z87891 Personal history of nicotine dependence: Secondary | ICD-10-CM | POA: Diagnosis not present

## 2021-05-10 DIAGNOSIS — D271 Benign neoplasm of left ovary: Secondary | ICD-10-CM | POA: Insufficient documentation

## 2021-05-10 DIAGNOSIS — Z79899 Other long term (current) drug therapy: Secondary | ICD-10-CM | POA: Diagnosis not present

## 2021-05-10 DIAGNOSIS — K660 Peritoneal adhesions (postprocedural) (postinfection): Secondary | ICD-10-CM | POA: Diagnosis not present

## 2021-05-10 DIAGNOSIS — Z793 Long term (current) use of hormonal contraceptives: Secondary | ICD-10-CM | POA: Insufficient documentation

## 2021-05-10 DIAGNOSIS — N83202 Unspecified ovarian cyst, left side: Secondary | ICD-10-CM | POA: Diagnosis present

## 2021-05-10 HISTORY — PX: LAPAROSCOPY: SHX197

## 2021-05-10 HISTORY — DX: Unspecified ovarian cyst, left side: N83.202

## 2021-05-10 HISTORY — DX: Leiomyoma of uterus, unspecified: D25.9

## 2021-05-10 HISTORY — PX: LAPAROSCOPIC LYSIS OF ADHESIONS: SHX5905

## 2021-05-10 HISTORY — DX: Gastro-esophageal reflux disease without esophagitis: K21.9

## 2021-05-10 HISTORY — DX: Personal history of other specified conditions: Z87.898

## 2021-05-10 HISTORY — PX: LAPAROSCOPIC SALPINGO OOPHERECTOMY: SHX5927

## 2021-05-10 HISTORY — DX: Thyrotoxicosis with diffuse goiter without thyrotoxic crisis or storm: E05.00

## 2021-05-10 HISTORY — DX: Irritable bowel syndrome, unspecified: K58.9

## 2021-05-10 HISTORY — PX: LAPAROSCOPIC OVARIAN CYSTECTOMY: SHX6248

## 2021-05-10 LAB — CBC
HCT: 39.4 % (ref 36.0–46.0)
Hemoglobin: 13.1 g/dL (ref 12.0–15.0)
MCH: 30.3 pg (ref 26.0–34.0)
MCHC: 33.2 g/dL (ref 30.0–36.0)
MCV: 91.2 fL (ref 80.0–100.0)
Platelets: 364 10*3/uL (ref 150–400)
RBC: 4.32 MIL/uL (ref 3.87–5.11)
RDW: 12.5 % (ref 11.5–15.5)
WBC: 5.6 10*3/uL (ref 4.0–10.5)
nRBC: 0 % (ref 0.0–0.2)

## 2021-05-10 LAB — POCT PREGNANCY, URINE: Preg Test, Ur: NEGATIVE

## 2021-05-10 LAB — TYPE AND SCREEN
ABO/RH(D): O POS
Antibody Screen: NEGATIVE

## 2021-05-10 SURGERY — EXCISION, CYST, OVARY, LAPAROSCOPIC
Anesthesia: General | Site: Abdomen

## 2021-05-10 MED ORDER — FENTANYL CITRATE (PF) 100 MCG/2ML IJ SOLN
INTRAMUSCULAR | Status: AC
  Filled 2021-05-10: qty 2

## 2021-05-10 MED ORDER — LIDOCAINE HCL (CARDIAC) PF 100 MG/5ML IV SOSY
PREFILLED_SYRINGE | INTRAVENOUS | Status: DC | PRN
  Administered 2021-05-10: 100 mg via INTRAVENOUS

## 2021-05-10 MED ORDER — ONDANSETRON HCL 8 MG PO TABS: 8.0000 mg | ORAL_TABLET | Freq: Three times a day (TID) | ORAL | 0 refills | Status: DC | PRN

## 2021-05-10 MED ORDER — ROCURONIUM BROMIDE 100 MG/10ML IV SOLN
INTRAVENOUS | Status: DC | PRN
  Administered 2021-05-10 (×2): 10 mg via INTRAVENOUS
  Administered 2021-05-10: 60 mg via INTRAVENOUS

## 2021-05-10 MED ORDER — ROCURONIUM BROMIDE 10 MG/ML (PF) SYRINGE
PREFILLED_SYRINGE | INTRAVENOUS | Status: AC
  Filled 2021-05-10: qty 10

## 2021-05-10 MED ORDER — SCOPOLAMINE 1 MG/3DAYS TD PT72
1.0000 | MEDICATED_PATCH | Freq: Once | TRANSDERMAL | Status: DC
  Administered 2021-05-10: 1.5 mg via TRANSDERMAL

## 2021-05-10 MED ORDER — LACTATED RINGERS IV SOLN
INTRAVENOUS | Status: DC
  Administered 2021-05-10: 1000 mL via INTRAVENOUS

## 2021-05-10 MED ORDER — ONDANSETRON HCL 4 MG/2ML IJ SOLN
INTRAMUSCULAR | Status: AC
  Filled 2021-05-10: qty 2

## 2021-05-10 MED ORDER — OXYCODONE HCL 5 MG PO TABS
5.0000 mg | ORAL_TABLET | ORAL | Status: DC | PRN
Start: 2021-05-10 — End: 2021-05-10
  Administered 2021-05-10: 5 mg via ORAL

## 2021-05-10 MED ORDER — DEXAMETHASONE SODIUM PHOSPHATE 4 MG/ML IJ SOLN
INTRAMUSCULAR | Status: DC | PRN
  Administered 2021-05-10: 10 mg via INTRAVENOUS

## 2021-05-10 MED ORDER — LIDOCAINE 2% (20 MG/ML) 5 ML SYRINGE
INTRAMUSCULAR | Status: AC
  Filled 2021-05-10: qty 5

## 2021-05-10 MED ORDER — ONDANSETRON HCL 4 MG PO TABS: 8.0000 mg | ORAL_TABLET | Freq: Three times a day (TID) | ORAL | Status: DC | PRN

## 2021-05-10 MED ORDER — KETOROLAC TROMETHAMINE 30 MG/ML IJ SOLN
INTRAMUSCULAR | Status: AC
  Filled 2021-05-10: qty 1

## 2021-05-10 MED ORDER — OXYCODONE HCL 5 MG PO TABS
ORAL_TABLET | ORAL | Status: AC
  Filled 2021-05-10: qty 1

## 2021-05-10 MED ORDER — LACTATED RINGERS IV SOLN: INTRAVENOUS | Status: DC

## 2021-05-10 MED ORDER — FENTANYL CITRATE (PF) 100 MCG/2ML IJ SOLN
INTRAMUSCULAR | Status: DC | PRN
  Administered 2021-05-10 (×2): 25 ug via INTRAVENOUS
  Administered 2021-05-10 (×2): 50 ug via INTRAVENOUS
  Administered 2021-05-10: 100 ug via INTRAVENOUS
  Administered 2021-05-10: 50 ug via INTRAVENOUS

## 2021-05-10 MED ORDER — ACETAMINOPHEN 500 MG PO TABS
ORAL_TABLET | ORAL | Status: AC
  Filled 2021-05-10: qty 2

## 2021-05-10 MED ORDER — ACETAMINOPHEN 500 MG PO TABS
1000.0000 mg | ORAL_TABLET | Freq: Once | ORAL | Status: AC
  Administered 2021-05-10: 1000 mg via ORAL

## 2021-05-10 MED ORDER — PROMETHAZINE HCL 25 MG/ML IJ SOLN: 6.2500 mg | INTRAMUSCULAR | Status: DC | PRN

## 2021-05-10 MED ORDER — EPHEDRINE SULFATE-NACL 50-0.9 MG/10ML-% IV SOSY
PREFILLED_SYRINGE | INTRAVENOUS | Status: DC | PRN
  Administered 2021-05-10: 10 mg via INTRAVENOUS

## 2021-05-10 MED ORDER — DEXAMETHASONE SODIUM PHOSPHATE 10 MG/ML IJ SOLN
INTRAMUSCULAR | Status: AC
  Filled 2021-05-10: qty 1

## 2021-05-10 MED ORDER — KETOROLAC TROMETHAMINE 30 MG/ML IJ SOLN
INTRAMUSCULAR | Status: DC | PRN
  Administered 2021-05-10: 30 mg via INTRAVENOUS

## 2021-05-10 MED ORDER — SCOPOLAMINE 1 MG/3DAYS TD PT72
MEDICATED_PATCH | TRANSDERMAL | Status: AC
  Filled 2021-05-10: qty 1

## 2021-05-10 MED ORDER — ONDANSETRON HCL 4 MG/2ML IJ SOLN
INTRAMUSCULAR | Status: DC | PRN
  Administered 2021-05-10: 4 mg via INTRAVENOUS

## 2021-05-10 MED ORDER — SUGAMMADEX SODIUM 200 MG/2ML IV SOLN
INTRAVENOUS | Status: DC | PRN
  Administered 2021-05-10: 200 mg via INTRAVENOUS

## 2021-05-10 MED ORDER — EPHEDRINE 5 MG/ML INJ
INTRAVENOUS | Status: AC
  Filled 2021-05-10: qty 10

## 2021-05-10 MED ORDER — SODIUM CHLORIDE 0.9 % IR SOLN
Status: DC | PRN
  Administered 2021-05-10: 3000 mL

## 2021-05-10 MED ORDER — MIDAZOLAM HCL 2 MG/2ML IJ SOLN
INTRAMUSCULAR | Status: AC
  Filled 2021-05-10: qty 2

## 2021-05-10 MED ORDER — FENTANYL CITRATE (PF) 100 MCG/2ML IJ SOLN: 25.0000 ug | INTRAMUSCULAR | Status: DC | PRN

## 2021-05-10 MED ORDER — BUPIVACAINE HCL (PF) 0.5 % IJ SOLN
INTRAMUSCULAR | Status: DC | PRN
  Administered 2021-05-10: 8 mL

## 2021-05-10 MED ORDER — PROPOFOL 10 MG/ML IV BOLUS
INTRAVENOUS | Status: DC | PRN
  Administered 2021-05-10: 50 mg via INTRAVENOUS
  Administered 2021-05-10: 200 mg via INTRAVENOUS

## 2021-05-10 MED ORDER — PROPOFOL 10 MG/ML IV BOLUS
INTRAVENOUS | Status: AC
  Filled 2021-05-10: qty 20

## 2021-05-10 MED ORDER — MIDAZOLAM HCL 5 MG/5ML IJ SOLN
INTRAMUSCULAR | Status: DC | PRN
  Administered 2021-05-10: 2 mg via INTRAVENOUS

## 2021-05-10 MED ORDER — OXYCODONE HCL 5 MG PO TABS: 5.0000 mg | ORAL_TABLET | ORAL | 0 refills | Status: DC | PRN

## 2021-05-10 SURGICAL SUPPLY — 44 items
ADH SKN CLS APL DERMABOND .7 (GAUZE/BANDAGES/DRESSINGS) ×3
APL SRG 38 LTWT LNG FL B (MISCELLANEOUS) ×3
APL SWBSTK 6 STRL LF DISP (MISCELLANEOUS) ×3
APPLICATOR ARISTA FLEXITIP XL (MISCELLANEOUS) ×5 IMPLANT
APPLICATOR COTTON TIP 6 STRL (MISCELLANEOUS) ×3 IMPLANT
APPLICATOR COTTON TIP 6IN STRL (MISCELLANEOUS) ×5
BAG SPEC RTRVL LRG 6X4 10 (ENDOMECHANICALS) ×3
BLADE SURG 10 STRL SS (BLADE) ×10 IMPLANT
COVER WAND RF STERILE (DRAPES) ×5 IMPLANT
DERMABOND ADVANCED (GAUZE/BANDAGES/DRESSINGS) ×2
DERMABOND ADVANCED .7 DNX12 (GAUZE/BANDAGES/DRESSINGS) ×3 IMPLANT
DURAPREP 26ML APPLICATOR (WOUND CARE) ×5 IMPLANT
GAUZE SPONGE 4X4 12PLY STRL LF (GAUZE/BANDAGES/DRESSINGS) ×10 IMPLANT
GLOVE ECLIPSE 6.5 STRL STRAW (GLOVE) ×5 IMPLANT
GLOVE SURG ENC MOIS LTX SZ6 (GLOVE) ×10 IMPLANT
GLOVE SURG LTX SZ6.5 (GLOVE) ×5 IMPLANT
GLOVE SURG POLYISO LF SZ7 (GLOVE) ×5 IMPLANT
GLOVE SURG UNDER POLY LF SZ6.5 (GLOVE) ×25 IMPLANT
GLOVE SURG UNDER POLY LF SZ7 (GLOVE) ×15 IMPLANT
GOWN STRL REUS W/TWL LRG LVL3 (GOWN DISPOSABLE) ×15 IMPLANT
HEMOSTAT ARISTA ABSORB 3G PWDR (HEMOSTASIS) ×5 IMPLANT
IV NS IRRIG 3000ML ARTHROMATIC (IV SOLUTION) ×5 IMPLANT
KIT TURNOVER CYSTO (KITS) ×5 IMPLANT
LIGASURE VESSEL 5MM BLUNT TIP (ELECTROSURGICAL) ×5 IMPLANT
NEEDLE HYPO 22GX1.5 SAFETY (NEEDLE) ×5 IMPLANT
NEEDLE INSUFFLATION 120MM (ENDOMECHANICALS) ×5 IMPLANT
NS IRRIG 500ML POUR BTL (IV SOLUTION) ×5 IMPLANT
PACK LAPAROSCOPY BASIN (CUSTOM PROCEDURE TRAY) ×5 IMPLANT
PACK TRENDGUARD 450 HYBRID PRO (MISCELLANEOUS) ×3 IMPLANT
PENCIL BUTTON HOLSTER BLD 10FT (ELECTRODE) IMPLANT
POUCH SPECIMEN RETRIEVAL 10MM (ENDOMECHANICALS) ×5 IMPLANT
PROTECTOR NERVE ULNAR (MISCELLANEOUS) ×10 IMPLANT
SCISSORS LAP 5X35 DISP (ENDOMECHANICALS) ×5 IMPLANT
SET IRRIG TUBING LAPAROSCOPIC (IRRIGATION / IRRIGATOR) ×5 IMPLANT
SET TUBE SMOKE EVAC HIGH FLOW (TUBING) ×5 IMPLANT
SUT VIC AB 1 CT1 36 (SUTURE) ×5 IMPLANT
SUT VICRYL 0 UR6 27IN ABS (SUTURE) ×5 IMPLANT
SUT VICRYL RAPIDE 3 0 (SUTURE) ×10 IMPLANT
SYR 50ML LL SCALE MARK (SYRINGE) ×5 IMPLANT
TRENDGUARD 450 HYBRID PRO PACK (MISCELLANEOUS) ×5
TROCAR 12M 150ML BLUNT (TROCAR) ×5 IMPLANT
TROCAR BLADELESS OPT 5 100 (ENDOMECHANICALS) ×10 IMPLANT
TROCAR XCEL NON-BLD 11X100MML (ENDOMECHANICALS) ×5 IMPLANT
WARMER LAPAROSCOPE (MISCELLANEOUS) ×5 IMPLANT

## 2021-05-10 NOTE — Discharge Instructions (Signed)
Post Anesthesia Home Care Instructions  Activity: Get plenty of rest for the remainder of the day. A responsible adult should stay with you for 24 hours following the procedure.  For the next 24 hours, DO NOT: -Drive a car -Operate machinery -Drink alcoholic beverages -Take any medication unless instructed by your physician -Make any legal decisions or sign important papers.  Meals: Start with liquid foods such as gelatin or soup. Progress to regular foods as tolerated. Avoid greasy, spicy, heavy foods. If nausea and/or vomiting occur, drink only clear liquids until the nausea and/or vomiting subsides. Call your physician if vomiting continues.  Special Instructions/Symptoms: Your throat may feel dry or sore from the anesthesia or the breathing tube placed in your throat during surgery. If this causes discomfort, gargle with warm salt water. The discomfort should disappear within 24 hours.  If you had a scopolamine patch placed behind your ear for the management of post- operative nausea and/or vomiting:  1. The medication in the patch is effective for 72 hours, after which it should be removed.  Wrap patch in a tissue and discard in the trash. Wash hands thoroughly with soap and water. 2. You may remove the patch earlier than 72 hours if you experience unpleasant side effects which may include dry mouth, dizziness or visual disturbances. 3. Avoid touching the patch. Wash your hands with soap and water after contact with the patch.   DISCHARGE INSTRUCTIONS: Laparoscopy  The following instructions have been prepared to help you care for yourself upon your return home today.  Wound care: . Do not get the incision wet for the first 24 hours. The incision should be kept clean and dry. . The Band-Aids or dressings may be removed the day after surgery. . Should the incision become sore, red, and swollen after the first week, check with your doctor.  Personal hygiene: . Shower the day  after your procedure.  Activity and limitations: . Do NOT drive or operate any equipment today. . Do NOT lift anything more than 15 pounds for 2-3 weeks after surgery. . Do NOT rest in bed all day. . Walking is encouraged. Walk each day, starting slowly with 5-minute walks 3 or 4 times a day. Slowly increase the length of your walks. . Walk up and down stairs slowly. . Do NOT do strenuous activities, such as golfing, playing tennis, bowling, running, biking, weight lifting, gardening, mowing, or vacuuming for 2-4 weeks. Ask your doctor when it is okay to start.  Diet: Eat a light meal as desired this evening. You may resume your usual diet tomorrow.  Return to work: This is dependent on the type of work you do. For the most part you can return to a desk job within a week of surgery. If you are more active at work, please discuss this with your doctor.  What to expect after your surgery: You may have a slight burning sensation when you urinate on the first day. You may have a very small amount of blood in the urine. Expect to have a small amount of vaginal discharge/light bleeding for 1-2 weeks. It is not unusual to have abdominal soreness and bruising for up to 2 weeks. You may be tired and need more rest for about 1 week. You may experience shoulder pain for 24-72 hours. Lying flat in bed may relieve it.  Call your doctor for any of the following: . Develop a fever of 100.4 or greater . Inability to urinate 6 hours after discharge   from hospital . Severe pain not relieved by pain medications . Persistent of heavy bleeding at incision site . Redness or swelling around incision site after a week . Increasing nausea or vomiting  Patient Signature________________________________________ Nurse Signature_________________________________________ 

## 2021-05-10 NOTE — Anesthesia Preprocedure Evaluation (Signed)
Anesthesia Evaluation  Patient identified by MRN, date of birth, ID band Patient awake    Reviewed: Allergy & Precautions, NPO status , Patient's Chart, lab work & pertinent test results  Airway Mallampati: II  TM Distance: >3 FB Neck ROM: Full    Dental  (+) Teeth Intact, Dental Advisory Given   Pulmonary neg pulmonary ROS, former smoker,    Pulmonary exam normal breath sounds clear to auscultation       Cardiovascular negative cardio ROS Normal cardiovascular exam Rhythm:Regular Rate:Normal     Neuro/Psych  Headaches, PSYCHIATRIC DISORDERS Anxiety    GI/Hepatic Neg liver ROS, GERD  ,  Endo/Other  Hyperthyroidism (methimazole) Obesity   Renal/GU negative Renal ROS     Musculoskeletal negative musculoskeletal ROS (+)   Abdominal   Peds  (+) ADHD Hematology negative hematology ROS (+)   Anesthesia Other Findings Day of surgery medications reviewed with the patient.  Reproductive/Obstetrics Left ovarian cyst, fibroid                              Anesthesia Physical Anesthesia Plan  ASA: II  Anesthesia Plan: General   Post-op Pain Management:    Induction: Intravenous  PONV Risk Score and Plan: 4 or greater and Midazolam, Scopolamine patch - Pre-op, Dexamethasone and Ondansetron  Airway Management Planned: Oral ETT  Additional Equipment:   Intra-op Plan:   Post-operative Plan: Extubation in OR  Informed Consent: I have reviewed the patients History and Physical, chart, labs and discussed the procedure including the risks, benefits and alternatives for the proposed anesthesia with the patient or authorized representative who has indicated his/her understanding and acceptance.     Dental advisory given  Plan Discussed with: CRNA  Anesthesia Plan Comments:         Anesthesia Quick Evaluation

## 2021-05-10 NOTE — H&P (Signed)
35 y.o. G1P1001 presents for schedule diagnostic laparoscopy. In 2019, while pregnant, an MFM scan showed simple cyst 6 x 6 cm.  Pt seen for AEX 01/2021 and discussed prior simple cyst without further eval.  Pt had not had any pain or problem.  Follow up scan performed 02/27/2021: normal uterus with small fibroid, LO: 12 x 9 x 10cm cyst, small nural nodule, laying debris. Spoke with Radiologist at [facility] imaging to clarify findings. He reported no formal doppler studies, but did state that mass looked completely avascular. MRI scheduled for 03/27/2021: 7.8 x 2.8 x 4.7cm uterus with1.3cm subserosal fibroid.  RO wnl, LO with 9.0 x 10.4 x 9.5cm unilocular thin wall neoplasm, anterior cental pelvis, arising from LO, no concerning features.  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   Pt desires laparoscopic removal. Pre-op eval with  Ca-125: 22.8.  Past Medical History:  Diagnosis Date  . ADHD   . Anxiety   . 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 GERD (gastroesophageal reflux disease)   . Graves' disease    endocrinology--- dr Shellia Cleverly--- dx 02/ 2021   . Hemorrhoids   . History of 2019 novel coronavirus disease (COVID-19) 07/2019   pt stated mild symptoms that resolved  . Hx of nausea    with pain meds  . IBS (irritable bowel syndrome)   . Left ovarian cyst   . Migraine headache   . Uterine fibroid    Past Surgical History:  Procedure Laterality Date  . TONSILLECTOMY  2006  . WISDOM TOOTH EXTRACTION  2006    Social History   Socioeconomic History  . Marital status: Married    Spouse name: Not on file  . Number of children: Not on file  . Years of education: Not on file  . Highest education level: Not on file  Occupational History  . Not on file  Tobacco Use  . Smoking status: Former Smoker    Years: 2.00    Types: Cigarettes    Quit date: 05/08/2011    Years since quitting: 10.0  . Smokeless tobacco: Never Used  Vaping Use   . Vaping Use: Some days  . Devices: hyde,  only occasional  Substance and Sexual Activity  . Alcohol use: Yes    Comment: occasional  . Drug use: Never  . Sexual activity: Not on file  Other Topics Concern  . Not on file  Social History Narrative   ** Merged History Encounter **       Social Determinants of Health   Financial Resource Strain: Not on file  Food Insecurity: Not on file  Transportation Needs: Not on file  Physical Activity: Not on file  Stress: Not on file  Social Connections: Not on file  Intimate Partner Violence: Not on file    No current facility-administered medications on file prior to encounter.   Current Outpatient Medications on File Prior to Encounter  Medication Sig Dispense Refill  . ADDERALL XR 10 MG 24 hr capsule Take 10 mg by mouth daily. When awake in morning    . amphetamine-dextroamphetamine (ADDERALL) 15 MG tablet Take 15 mg by mouth 2 (two) times daily. Takes one tablet at 1000 to 1100 and one tablet 1300 to 1400    . Aspirin-Salicylamide-Caffeine (BC HEADACHE POWDER PO) Take by mouth as needed.    . calcium carbonate (TUMS - DOSED IN MG ELEMENTAL CALCIUM) 500 MG chewable tablet Chew 1 tablet by mouth as needed  for indigestion or heartburn.    Marland Kitchen FLUoxetine (PROZAC) 20 MG tablet Take 20 mg by mouth daily.    Marland Kitchen ibuprofen (ADVIL) 200 MG tablet Take 200 mg by mouth every 6 (six) hours as needed.    . methimazole (TAPAZOLE) 5 MG tablet Take 0.5 tablets (2.5 mg total) by mouth daily. With a meal. (Patient taking differently: Take 2.5 mg by mouth daily. With a meal.) 45 tablet 3  . Norethindrone Acetate-Ethinyl Estrad-FE (BLISOVI 24 FE) 1-20 MG-MCG(24) tablet Take 1 tablet by mouth at bedtime.      Allergies  Allergen Reactions  . Thimerosal Other (See Comments)    Pt stated had positive allergy test     Vitals:   05/07/21 1452  Weight: 93 kg  Height: 5' 7.5" (1.715 m)   n office: Lungs: clear to ascultation Cor:  RRR Abdomen:  soft,  nontender, nondistended. Ex:  no cords, erythema Pelvic:  Deferred to OR  A: Large LO benign appearing cyst >10cm with patient desiring removal.   P: All risks, benefits and alternative including possible need for LSO or ex lap. Discussed risk of damage to adjacent tissues including bowel,bladder, ureters, or vessels as well as potential infection Discussed possible intra-operative rupture of cyst with likely low risk benign neoplasm and potential complications of intra-operative rupture. SCDs No pre-op abx  Allyn Kenner

## 2021-05-10 NOTE — Transfer of Care (Signed)
Immediate Anesthesia Transfer of Care Note  Patient: Patricia Cruz  Procedure(s) Performed: LAPAROSCOPIC OVARIAN CYSTECTOMY (Left Abdomen) LAPAROSCOPIC SALPINGO OOPHORECTOMY (Left Abdomen) LAPAROSCOPY DIAGNOSTIC (Abdomen) LAPAROSCOPIC LYSIS OF ADHESIONS (Abdomen)  Patient Location: PACU  Anesthesia Type:General  Level of Consciousness: awake, alert , oriented and patient cooperative  Airway & Oxygen Therapy: Patient Spontanous Breathing  Post-op Assessment: Report given to RN and Post -op Vital signs reviewed and stable  Post vital signs: Reviewed and stable  Last Vitals:  Vitals Value Taken Time  BP    Temp    Pulse 107 05/10/21 1432  Resp 17 05/10/21 1433  SpO2 95 % 05/10/21 1432  Vitals shown include unvalidated device data.  Last Pain:  Vitals:   05/10/21 1106  TempSrc: Oral  PainSc: 0-No pain      Patients Stated Pain Goal: 6 (29/51/88 4166)  Complications: No complications documented.

## 2021-05-10 NOTE — Anesthesia Procedure Notes (Signed)
Procedure Name: Intubation Date/Time: 05/10/2021 12:13 PM Performed by: Justice Rocher, CRNA Pre-anesthesia Checklist: Patient identified, Emergency Drugs available, Suction available, Patient being monitored and Timeout performed Patient Re-evaluated:Patient Re-evaluated prior to induction Oxygen Delivery Method: Circle system utilized Preoxygenation: Pre-oxygenation with 100% oxygen Induction Type: IV induction Ventilation: Mask ventilation without difficulty Laryngoscope Size: Mac and 3 Grade View: Grade II Tube type: Oral Tube size: 7.0 mm Number of attempts: 1 Airway Equipment and Method: Stylet and Oral airway Placement Confirmation: ETT inserted through vocal cords under direct vision,  positive ETCO2,  breath sounds checked- equal and bilateral and CO2 detector Secured at: 22 cm Tube secured with: Tape Dental Injury: Teeth and Oropharynx as per pre-operative assessment

## 2021-05-10 NOTE — Brief Op Note (Addendum)
05/10/2021  2:33 PM  PATIENT:  Patricia Cruz  35 y.o. female  PRE-OPERATIVE DIAGNOSIS:  Left ovarian cyst  POST-OPERATIVE DIAGNOSIS:  Left ovarian cyst, bowel adhesions to left tube/ovary and IP ligament  PROCEDURE:  Procedure(s): LAPAROSCOPIC OVARIAN CYSTECTOMY (Left) LAPAROSCOPIC SALPINGO OOPHORECTOMY (Left) LAPAROSCOPY DIAGNOSTIC LAPAROSCOPIC LYSIS OF ADHESIONS  SURGEON:  Surgeon(s) and Role:    Allyn Kenner, DO - Primary    * Rowland Lathe, MD - Assisting   ANESTHESIA:   local and general  EBL:  50 mL   FLUID removed laparoscopically from left ovarian cyst: 575cc, clear pale yellow  BLOOD ADMINISTERED:none  LOCAL MEDICATIONS USED:  XYLOCAINE  and Amount: 3 ml  FINDINGS: normal appearing uterus, right tube and right ovary; left adnexa with adhesive disease involving left ovary with large smooth surface cyst, and left fallopian adherent to small bowel, left pelvic sidewall and left IP ligament, no other adhesive disease in other quadrants.  SPECIMEN:  Source of Specimen:  left ovary containing cyst and left fallopian tube, exterior and interior of cyst examined, no nodules or other concerning features  DISPOSITION OF SPECIMEN:  PATHOLOGY  COUNTS:  YES  PLAN OF CARE: Discharge to home after PACU  PATIENT DISPOSITION:  PACU - hemodynamically stable.   Delay start of Pharmacological VTE agent (>24hrs) due to surgical blood loss or risk of bleeding: not applicable

## 2021-05-10 NOTE — Anesthesia Postprocedure Evaluation (Signed)
Anesthesia Post Note  Patient: Patricia Cruz  Procedure(s) Performed: LAPAROSCOPIC OVARIAN CYSTECTOMY (Left Abdomen) LAPAROSCOPIC SALPINGO OOPHORECTOMY (Left Abdomen) LAPAROSCOPY DIAGNOSTIC (Abdomen) LAPAROSCOPIC LYSIS OF ADHESIONS (Abdomen)     Patient location during evaluation: PACU Anesthesia Type: General Level of consciousness: awake and alert Pain management: pain level controlled Vital Signs Assessment: post-procedure vital signs reviewed and stable Respiratory status: spontaneous breathing, nonlabored ventilation and respiratory function stable Cardiovascular status: blood pressure returned to baseline and stable Postop Assessment: no apparent nausea or vomiting Anesthetic complications: no   No complications documented.  Last Vitals:  Vitals:   05/10/21 1505 05/10/21 1515  BP:  119/70  Pulse: 70 71  Resp: 11 (!) 9  Temp:    SpO2: 97% 94%    Last Pain:  Vitals:   05/10/21 1106  TempSrc: Oral  PainSc: 0-No pain                 Catalina Gravel

## 2021-05-13 ENCOUNTER — Encounter (HOSPITAL_BASED_OUTPATIENT_CLINIC_OR_DEPARTMENT_OTHER): Payer: Self-pay | Admitting: Obstetrics and Gynecology

## 2021-05-13 LAB — SURGICAL PATHOLOGY

## 2021-05-13 NOTE — Op Note (Signed)
NAME: Patricia Cruz, Patricia Cruz MEDICAL RECORD NO: [id number] ACCOUNT NO: [id number] DATE OF BIRTH: 31-May-1986 FACILITY: [facility] LOCATION: [facility] PHYSICIAN: Jaclynn Major. Rogue Bussing, DO  Operative Report   DATE OF PROCEDURE: 05/10/2021  PREOPERATIVE DIAGNOSIS:  Left ovarian cyst.  POSTOPERATIVE DIAGNOSES:  Left ovarian cyst; bowel adhesions to left tube, ovary, and IP ligament.  PROCEDURE:  Laparoscopic left salpingo-oophorectomy with removal of cyst, diagnostic laparoscopy, and lysis of adhesions.  SURGEON:  Allyn Kenner, DO  ASSISTANT:  Irene Pap, MD  ANESTHESIA:  Local and general.  ESTIMATED BLOOD LOSS:  50 mL or less.  575 mL of clear pale yellow fluid removed laparoscopically from left ovarian cyst for decompression.  LOCAL MEDICATIONS USED: Xylocaine 3 mL for subcutaneous infiltration for incision.  FINDINGS:  Normal-appearing uterus, right tube and ovary.  Left adnexa with adhesive disease involving the left ovary with some large, smooth surfaced cyst, and left fallopian tube adherent to small bowel, left pelvic sidewall, and left IP ligament.  No  other adhesive disease in other quadrants noted.  SPECIMENS:  Left ovary containing cyst and left fallopian tube, exterior and interior of cyst were examined postoperatively.  No nodules or other concerning features found.  Specimen to pathology.  COMPLICATIONS:  None.  COUNTS:  Correct x2.  PATIENT'S DISPOSITION:  To PACU in stable condition.  DESCRIPTION OF PROCEDURE:  The patient was taken to the operating room where anesthesia was administered and found to be adequate.  She was prepped and draped in the normal sterile fashion in dorsal lithotomy position.  A speculum was placed, and with  good visualization of the cervix, it was grasped anteriorly with a single tooth tenaculum and acorn uterine manipulator was advanced and attached to the single tooth tenaculum.  Good movement of the uterus and cervix were shown.  Attention was  turned to  the abdomen where prep and drape had been performed. The infraumbilical fold was examined, infiltrated with Xylocaine, and an approximately 10 mm vertical incision was placed.  With the patient flat, Visiport trocar was used and advanced to the  peritoneum, which was entered without difficulty.  Insufflation was started; however, on low flow, high pressure was noted, so insufflation was stopped and the camera was once again advanced.  The trocar had slipped back slightly.  Repositioning was  attempted, and due to the short length of the trocar, it was not adequately advanced.  It was removed, and a longer Visiport trocar was advanced without difficulty.  Camera was removed and insufflation started on low flow documenting low pressure.  The  abdomen was then insufflated, and camera was advanced. 5 mm ports were placed in the left and right lower quadrants medial to the ASIS on both sides.  Xylocaine was placed first subcutaneously.  Incision was made with the scalpel on each side with direct  visualization, and 5 mm trocars were advanced under direct visualization bilaterally.  The very large what was presumed to be the left ovary containing a large cyst was obscuring the view of the uterus and the remaining structures of the pelvis.  The  left ureter was able to be visualized by lifting the cyst; however, due to its size, it was difficult to manipulate.  It appeared to be somewhat twisted, and there was scar tissue as listed above that made further dissection and manipulation of the area  difficult.  Decision was made, given the benign prior preoperative evaluation and the benign appearance on entrance into the abdomen, to decompress the cyst and  remove the cystic fluid using a syringe. Under direct visualization, the needle attached to a  syringe was used to puncture the cyst and no fluid was visibly leaking from the cyst.  All fluid appeared to be removed via the syringe, and fluid was pale  yellow, hay color.  Once all of the fluid was removed, the remaining wall of the ovary and cyst  was sufficiently decompressed to visualize the remainder of the anatomy of the pelvis.  A LigaSure was used to cauterize and transect the utero-ovarian ligament and the proximal aspect of the fallopian tube.  The mesosalpinx was serially transected and  cauterized laterally with good visualization.  The adhesive disease involving the small bowel, ovary and cyst, fallopian tube, and pelvic sidewall was gently taken down using cold scissors and LigaSure being sure to examine from the top and underneath  prior to any transection ensuring that no bowel was present.  Once the lysis of adhesions was performed, the IP ligament was easily identified and cauterized and transected using the LigaSure.  Hemostasis was assured.  A gallbladder bag was entered  through the umbilical incision with a 5 mm camera assisting in locating and placing the cyst, ovary, and fallopian tube in the bag.  The specimen was removed along with the umbilical trocar, and the specimen was handed off.  Probe was used to facilitate  re-entry of the umbilical trocar. Small bleeder, along the peritoneum of the area where pelvic adhesions were removed, was cauterized with LigaSure, and Arista was placed over the raw edges of this area.  Excellent hemostasis was noted.  The ureter was  re-examined and significantly lower than the surgical area.  The right tube and ovary were re-examined and found to be normal.  The upper abdomen was examined, and no additional adhesions were noted.  All appeared normal.  Attempt was made to visualize  the appendix.  It was not seen.  No other abnormalities were noted.  The gas was stopped, and the abdomen was desufflated.  The trocars were then removed, and the fascia of the umbilical incision was grasped with Kocher clamps and reapproximated with 0  Vicryl and closed in figure-of-eight.  The skin was then  reapproximated and closed subcuticularly with 3-0 Vicryl.  The 5 mm ports in the left and right lower quadrants were reapproximated subcuticularly with 3-0 Vicryl, and Dermabond was placed over all  3 incisions. Prior to closure of the incisions, five breaths were given and exchange of room air with CO2 was attempted to lessen chance of the patient having irritation postop.  The patient tolerated the procedure well.  Sponge, lap, and needle counts  were correct x2.  The tenaculum and acorn uterine manipulator were removed and no bleeding was noted.  The patient was taken to recovery in stable condition.   ROH D: 05/13/2021 8:19:28 pm T: 05/13/2021 10:08:00 pm  JOB: [id number]/ [id number]

## 2021-05-31 ENCOUNTER — Other Ambulatory Visit: Payer: Self-pay | Admitting: Internal Medicine

## 2021-06-03 ENCOUNTER — Encounter: Payer: Self-pay | Admitting: Internal Medicine

## 2021-06-03 ENCOUNTER — Other Ambulatory Visit: Payer: Self-pay

## 2021-06-03 ENCOUNTER — Ambulatory Visit: Payer: BC Managed Care – PPO | Admitting: Internal Medicine

## 2021-06-03 VITALS — BP 132/88 | HR 107 | Ht 67.5 in | Wt 211.8 lb

## 2021-06-03 DIAGNOSIS — E0500 Thyrotoxicosis with diffuse goiter without thyrotoxic crisis or storm: Secondary | ICD-10-CM | POA: Diagnosis not present

## 2021-06-03 NOTE — Patient Instructions (Signed)
Please continue Methimazole 2.5 mg daily.  Come back for labs.  Please come back for a follow-up appointment in 6 months.

## 2021-06-03 NOTE — Progress Notes (Signed)
Patient ID: Patricia Cruz, female   DOB: 06-Dec-1986, 35 y.o.   MRN: [id number]   This visit occurred during the SARS-CoV-2 public health emergency.  Safety protocols were in place, including screening questions prior to the visit, additional usage of staff PPE, and extensive cleaning of exam room while observing appropriate contact time as indicated for disinfecting solutions.   HPI  Patricia Cruz is a 35 y.o.-year-old female, initially referred by her PCP, Dr. Manuella Ghazi, returning for follow-up for Graves' disease.  Last visit 8 months ago.  Interim history: She had planned laparoscopic ovarian cystectomy 05/10/2021 - but had to have L oophorectomy. No immediate plans for pregnancy for now.  Reviewed and addended history: Patient was found to have thyrotoxicosis during investigation for fatigue and during an APE in 01/2020.  She mentions that at the previous annual physical exams, she had normal thyroid tests, however, she did not have an APE in 2020.  She gave birth to her daughter in 11/2018.  After giving birth, she had more anxiety depression but she attributes this to being out of antidepressants for the period of pregnancy and up to 07/2019.  She started feeling better after she restarted them in 07/2019.  She was also diagnosed with Covid 19 in 07/2019.  She continues to feel fatigued afterwards.  At last visit, her thyroid tests are still abnormal and her Graves' antibodies were elevated.    We started her on methimazole 5 mg daily.  TFTs improved afterwards.  09/2020: Methimazole dose reduced to 2.5 mg daily  Reviewed patient's TFTs: Lab Results  Component Value Date   TSH 1.47 01/23/2021   TSH 2.39 10/16/2020   TSH 0.30 (L) 06/05/2020   TSH <0.01 (L) 04/12/2020   FREET4 0.79 01/23/2021   FREET4 0.78 10/16/2020   FREET4 0.59 (L) 06/05/2020   FREET4 1.08 04/12/2020   T3FREE 3.9 01/23/2021   T3FREE 3.8 10/16/2020   T3FREE 3.5 06/05/2020   T3FREE 4.4 (H) 04/12/2020  02/02/2020: TSH  <0.005, free T3 6.2 (2-4.4) 01/27/2020: TSH <0.005  Her TSI antibodies were elevated: Lab Results  Component Value Date   TSI 157 (H) 04/12/2020   Pt denies: - feeling nodules in neck - hoarseness - dysphagia - choking - SOB with lying down  At diagnosis, she was mentioning: - + Fatigue - + excessive sweating/heat intolerance - chronic - after pregnancy - no tremors - + anxiety, + depression - exacerbated after pregnancy  - + palpitations - + diarrhea - but has IBS - + weight loss, with losing pregnancy weight + 10 lbs - + hair loss postpartum >> improved  Afterwards: -The above symptoms have improved significantly - but still has some hot flushes (unchanged); palpitations (she is on medication for ADD).  Pt does  have a FH of thyroid ds.: M with Graves ds., Father with hypothyroidism, MGM with hyperthyroidism and had thyroidectomy, MGGM - also thyroid ds. No FH of thyroid cancer. No h/o radiation tx to head or neck.  No herbal supplements. No Biotin use. No recent steroids use.   Of note, she had a normal white blood cell count on 01/27/2020: 4.4 (3.4-10.8) she also had normal LFTs at that time (will be scanned).  Pt. also has a history of ADD - on Adderall.  ROS: Constitutional: no weight gain/no weight loss, no fatigue, + mild subjective hyperthermia, no subjective hypothermia Eyes: no blurry vision, no xerophthalmia ENT: no sore throat, + see HPI Cardiovascular: no CP/no SOB/+ occasional palpitations/no leg swelling  Respiratory: no cough/no SOB/no wheezing Gastrointestinal: no N/no V/no D/no C Musculoskeletal: no muscle aches/no joint aches Skin: no rashes, no hair loss Neurological: no tremors/no numbness/no tingling/no dizziness  I reviewed pt's medications, allergies, PMH, social hx, family hx, and changes were documented in the history of present illness. Otherwise, unchanged from my initial visit note.  Past Medical History:  Diagnosis Date   ADHD    Anxiety     Atypical nevus 11/05/2012   Left Outer Hip Ant-Moderate, [place] Outer Hip Post-Moderate and Supra Pubic-Mild   GERD (gastroesophageal reflux disease)    Graves' disease    endocrinology--- dr Shellia Cleverly--- dx 02/ 2021    Hemorrhoids    History of 2019 novel coronavirus disease (COVID-19) 07/2019   pt stated mild symptoms that resolved   Hx of nausea    with pain meds   IBS (irritable bowel syndrome)    Left ovarian cyst    Migraine headache    Uterine fibroid    Past Surgical History:  Procedure Laterality Date   LAPAROSCOPIC LYSIS OF ADHESIONS  05/10/2021   Procedure: LAPAROSCOPIC LYSIS OF ADHESIONS;  Surgeon: Allyn Kenner, DO;  Location: [facility];  Service: Gynecology;;   LAPAROSCOPIC OVARIAN CYSTECTOMY Left 05/10/2021   Procedure: LAPAROSCOPIC OVARIAN CYSTECTOMY;  Surgeon: Allyn Kenner, DO;  Location: [facility];  Service: Gynecology;  Laterality: Left;   LAPAROSCOPIC SALPINGO OOPHERECTOMY Left 05/10/2021   Procedure: LAPAROSCOPIC SALPINGO OOPHORECTOMY;  Surgeon: Allyn Kenner, DO;  Location: [facility];  Service: Gynecology;  Laterality: Left;   LAPAROSCOPY  05/10/2021   Procedure: LAPAROSCOPY DIAGNOSTIC;  Surgeon: Allyn Kenner, DO;  Location: [facility];  Service: Gynecology;;   TONSILLECTOMY  2006   WISDOM TOOTH EXTRACTION  2006   Social History   Socioeconomic History   Marital status: Married    Spouse name: Not on file   Number of children: 0   Years of education: Not on file   Highest education level: Not on file  Occupational History    Market researcher  Tobacco Use   Smoking status: Never Smoker   Smokeless tobacco: Never Used  Substance and Sexual Activity   Alcohol use:  Wine or beer 1-2 drinks weekly   Drug use: Never   Sexual activity: Not on file  Other Topics Concern   Not on file  Social History Narrative   Not on file   Social Determinants of Health   Financial Resource  Strain:    Difficulty of Paying Living Expenses:   Food Insecurity:    Worried About Charity fundraiser in the Last Year:    Arboriculturist in the Last Year:   Transportation Needs:    Film/video editor (Medical):    Lack of Transportation (Non-Medical):   Physical Activity:    Days of Exercise per Week:    Minutes of Exercise per Session:   Stress:    Feeling of Stress :   Social Connections:    Frequency of Communication with Friends and Family:    Frequency of Social Gatherings with Friends and Family:    Attends Religious Services:    Active Member of Clubs or Organizations:    Attends Music therapist:    Marital Status:   Intimate Partner Violence:    Fear of Current or Ex-Partner:    Emotionally Abused:    Physically Abused:    Sexually Abused:    Current Outpatient Medications on File Prior  to Visit  Medication Sig Dispense Refill   ADDERALL XR 10 MG 24 hr capsule Take 10 mg by mouth daily. When awake in morning     amphetamine-dextroamphetamine (ADDERALL) 15 MG tablet Take 15 mg by mouth 2 (two) times daily. Takes one tablet at 1000 to 1100 and one tablet 1300 to 1448     Aspirin-Salicylamide-Caffeine (BC HEADACHE POWDER PO) Take by mouth as needed.     calcium carbonate (TUMS - DOSED IN MG ELEMENTAL CALCIUM) 500 MG chewable tablet Chew 1 tablet by mouth as needed for indigestion or heartburn.     FLUoxetine (PROZAC) 20 MG tablet Take 20 mg by mouth daily.     ibuprofen (ADVIL) 200 MG tablet Take 200 mg by mouth every 6 (six) hours as needed.     methimazole (TAPAZOLE) 5 MG tablet Take 0.5 tablets (2.5 mg total) by mouth daily. With a meal. (Patient taking differently: Take 2.5 mg by mouth daily. With a meal.) 45 tablet 3   Norethindrone Acetate-Ethinyl Estrad-FE (BLISOVI 24 FE) 1-20 MG-MCG(24) tablet Take 1 tablet by mouth at bedtime.     ondansetron (ZOFRAN) 8 MG tablet Take 1 tablet (8 mg total) by mouth every 8 (eight) hours as needed for nausea or  vomiting. 20 tablet 0   oxyCODONE (OXY IR/ROXICODONE) 5 MG immediate release tablet Take 1 tablet (5 mg total) by mouth every 4 (four) hours as needed for moderate pain or severe pain. 15 tablet 0   No current facility-administered medications on file prior to visit.   Allergies  Allergen Reactions   Thimerosal Other (See Comments)    Pt stated had positive allergy test      No family history on file.  PE: BP 132/88 (BP Location: Right Arm, Patient Position: Sitting, Cuff Size: Normal)   Pulse (!) 107   Ht 5' 7.5" (1.715 m)   Wt 211 lb 12.8 oz (96.1 kg)   SpO2 98%   BMI 32.68 kg/m  Wt Readings from Last 3 Encounters:  06/03/21 211 lb 12.8 oz (96.1 kg)  05/10/21 208 lb 6.4 oz (94.5 kg)  10/16/20 204 lb (92.5 kg)   Constitutional: overweight, in NAD Eyes: PERRLA, EOMI, no exophthalmos, no lid lag, no stare ENT: moist mucous membranes, no thyromegaly, no thyroid bruits, no cervical lymphadenopathy Cardiovascular: + Tachycardia, RR, No MRG Respiratory: CTA B Gastrointestinal: abdomen soft, NT, ND, BS+ Musculoskeletal: no deformities, strength intact in all 4 Skin: moist, warm, no rashes Neurological: no tremor with outstretched hands, DTR normal in all 4  ASSESSMENT: 1.  Graves' disease  PLAN:  1. Patient with history of a low TSH, with possible thyroid symptoms: Weight loss, heat intolerance, hyper defecation, palpitations, anxiety, tremors.  The majority of the symptoms have resolved, now only some heat intolerance and palpitations. -We diagnosed Graves' disease based on her history, elevated TSI antibodies and family history of the disease so we did not perform a thyroid uptake and scan -We discussed about possible modalities of treatment for Graves' disease to include methimazole, RAI treatment, and last resort, surgery.  Since she was planning another pregnancy, I suggested RAI treatment in the past but she wanted to try methimazole first.  At last visit she was on 5 mg  daily and we were able to decrease the dose to 2.5 mg daily.  -At today's visit, other than tachycardia, she does not have signs or symptoms consistent with active Graves' disease.  She is usually tachycardic at the time of her appointment possibly  due to Adderall. -At today's visit we will recheck her TFTs and TSI's and change the methimazole dose accordingly -At last visit, she was interested in another pregnancy, but not for now, after her left oophorectomy.  We discussed that, ideally, during pregnancy, she would be off thionamides, however, if we need to continue low-dose, will need to use PTU for the first trimester -No signs of Graves' ophthalmopathy: No blurry vision, double vision, eye pain, chemosis -I will see her back in 6 months but possibly sooner for labs  Orders Placed This Encounter  Procedures   TSH   T4, free   T3, free   Thyroid stimulating immunoglobulin   Component     Latest Ref Rng & Units 06/07/2021  TSH     0.35 - 4.50 uIU/mL 0.73  T4,Free(Direct)     0.60 - 1.60 ng/dL 0.79  Triiodothyronine,Free,Serum     2.3 - 4.2 pg/mL 3.5  TSI     <140 % baseline 123   TFTs are normal and her TSI's are now also normal.  I would suggest to continue the same dose of methimazole for now, 2.5 mg daily.  Philemon Kingdom, MD PhD [facility] Endocrinology

## 2021-06-04 ENCOUNTER — Encounter: Payer: Self-pay | Admitting: Internal Medicine

## 2021-06-07 ENCOUNTER — Other Ambulatory Visit: Payer: BC Managed Care – PPO

## 2021-06-07 ENCOUNTER — Other Ambulatory Visit: Payer: Self-pay

## 2021-06-07 ENCOUNTER — Other Ambulatory Visit (INDEPENDENT_AMBULATORY_CARE_PROVIDER_SITE_OTHER): Payer: BC Managed Care – PPO

## 2021-06-07 DIAGNOSIS — E0500 Thyrotoxicosis with diffuse goiter without thyrotoxic crisis or storm: Secondary | ICD-10-CM | POA: Diagnosis not present

## 2021-06-07 LAB — TSH: TSH: 0.73 u[IU]/mL (ref 0.35–4.50)

## 2021-06-07 LAB — T3, FREE: T3, Free: 3.5 pg/mL (ref 2.3–4.2)

## 2021-06-07 LAB — T4, FREE: Free T4: 0.79 ng/dL (ref 0.60–1.60)

## 2021-06-11 LAB — THYROID STIMULATING IMMUNOGLOBULIN: TSI: 123 % baseline (ref <140)

## 2022-03-03 DIAGNOSIS — N83201 Unspecified ovarian cyst, right side: Secondary | ICD-10-CM | POA: Insufficient documentation

## 2023-02-24 ENCOUNTER — Other Ambulatory Visit: Payer: BC Managed Care – PPO

## 2023-02-26 ENCOUNTER — Telehealth: Payer: Self-pay

## 2023-02-26 NOTE — Telephone Encounter (Signed)
Gave message to front desk to schedule lab appointment  per Dr. Darnell Level.

## 2023-02-26 NOTE — Telephone Encounter (Signed)
-----   Message from Philemon Kingdom, MD sent at 02/26/2023 11:55 AM EST ----- Regarding: RE: labs Pt needs an appt before labs since not seen in >1.5 years. C ----- Message ----- From: Nils Flack I Sent: 02/26/2023  11:24 AM EST To: Philemon Kingdom, MD Subject: labs                                           Please put labs in patient  coming in tomorrow.  Thanks

## 2023-02-27 ENCOUNTER — Ambulatory Visit: Payer: BC Managed Care – PPO | Admitting: Internal Medicine

## 2023-02-27 ENCOUNTER — Other Ambulatory Visit: Payer: BC Managed Care – PPO

## 2023-04-03 ENCOUNTER — Ambulatory Visit (INDEPENDENT_AMBULATORY_CARE_PROVIDER_SITE_OTHER): Payer: Managed Care, Other (non HMO) | Admitting: Internal Medicine

## 2023-04-03 ENCOUNTER — Encounter: Payer: Self-pay | Admitting: Internal Medicine

## 2023-04-03 VITALS — BP 122/82 | HR 81 | Ht 67.5 in | Wt 179.0 lb

## 2023-04-03 DIAGNOSIS — E0500 Thyrotoxicosis with diffuse goiter without thyrotoxic crisis or storm: Secondary | ICD-10-CM

## 2023-04-03 NOTE — Progress Notes (Unsigned)
Patient ID: Patricia Cruz, female   DOB: 05/20/1986, 37 y.o.   MRN: [id number]   HPI  Patricia Cruz is a 37 y.o.-year-old female, initially referred by her PCP, Dr. Sherryll Burger, returning for follow-up for Graves' disease.  Last visit 1 year and 10 months ago.   Interim history: Feels well, no complaints today. She has long standing anxiety, occasional palpitations, but not now. She lost 30 lbs since last visit - started Ozempic 02/2022.  Reviewed and addended history: Patient was found to have thyrotoxicosis during investigation for fatigue and during an APE in 01/2020.  She mentions that at the previous annual physical exams, she had normal thyroid tests, however, she did not have an APE in 2020.  She gave birth to her daughter in 11/2018.  After giving birth, she had more anxiety depression but she attributes this to being out of antidepressants for the period of pregnancy and up to 07/2019.  She started feeling better after she restarted them in 07/2019.  She was also diagnosed with Covid 19 in 07/2019.  She continues to feel fatigued afterwards.  At last visit, her thyroid tests are still abnormal and her Graves' antibodies were elevated.    We started her on methimazole 5 mg daily.  TFTs improved afterwards.  09/2020: Methimazole dose reduced to 2.5 mg daily  08/2021: she stopped MMI after switching pharmacies  Reviewed patient's TFTs: Lab Results  Component Value Date   TSH 0.73 06/07/2021   TSH 1.47 01/23/2021   TSH 2.39 10/16/2020   TSH 0.30 (L) 06/05/2020   TSH <0.01 (L) 04/12/2020   FREET4 0.79 06/07/2021   FREET4 0.79 01/23/2021   FREET4 0.78 10/16/2020   FREET4 0.59 (L) 06/05/2020   FREET4 1.08 04/12/2020   T3FREE 3.5 06/07/2021   T3FREE 3.9 01/23/2021   T3FREE 3.8 10/16/2020   T3FREE 3.5 06/05/2020   T3FREE 4.4 (H) 04/12/2020  02/02/2020: TSH <0.005, free T3 6.2 (2-4.4) 01/27/2020: TSH <0.005  Her TSI antibodies were elevated but normalized at last check: Lab Results   Component Value Date   TSI 123 06/07/2021   TSI 157 (H) 04/12/2020   Pt denies: - feeling nodules in neck - hoarseness - dysphagia - choking  At diagnosis, she was mentioning: - + Fatigue - + excessive sweating/heat intolerance - chronic - after pregnancy - no tremors - + anxiety, + depression - exacerbated after pregnancy  - + palpitations - + diarrhea - but has IBS - + weight loss, with losing pregnancy weight + 10 lbs - + hair loss postpartum >> improved  Afterwards: -The above symptoms have improved significantly - palpitations (she is on medication for ADD).  Pt does  have a FH of thyroid ds.: M with Graves ds., Father with hypothyroidism, MGM with hyperthyroidism and had thyroidectomy, MGGM - also thyroid ds. No FH of thyroid cancer. No h/o radiation tx to head or neck. No herbal supplements. No Biotin use. No recent steroids use.   Of note, she had a normal white blood cell count on 01/27/2020: 4.4 (3.4-10.8) she also had normal LFTs at that time. Pt. also has a history of ADD - on Adderall.  ROS: + See HPI  I reviewed pt's medications, allergies, PMH, social hx, family hx, and changes were documented in the history of present illness. Otherwise, unchanged from my initial visit note.  Past Medical History:  Diagnosis Date   ADHD    Anxiety    Atypical nevus 11/05/2012   Left Outer Hip  Ant-Moderate, Lefet Outer Hip Post-Moderate and Supra Pubic-Mild   GERD (gastroesophageal reflux disease)    Graves' disease    endocrinology--- dr Bevelyn Ngo--- dx 02/ 2021    Hemorrhoids    History of 2019 novel coronavirus disease (COVID-19) 07/2019   pt stated mild symptoms that resolved   Hx of nausea    with pain meds   IBS (irritable bowel syndrome)    Left ovarian cyst    Migraine headache    Uterine fibroid    Past Surgical History:  Procedure Laterality Date   LAPAROSCOPIC LYSIS OF ADHESIONS  05/10/2021   Procedure: LAPAROSCOPIC LYSIS OF ADHESIONS;  Surgeon: Philip Aspen, DO;  Location: [facility];  Service: Gynecology;;   LAPAROSCOPIC OVARIAN CYSTECTOMY Left 05/10/2021   Procedure: LAPAROSCOPIC OVARIAN CYSTECTOMY;  Surgeon: Philip Aspen, DO;  Location: [facility];  Service: Gynecology;  Laterality: Left;   LAPAROSCOPIC SALPINGO OOPHERECTOMY Left 05/10/2021   Procedure: LAPAROSCOPIC SALPINGO OOPHORECTOMY;  Surgeon: Philip Aspen, DO;  Location: [facility];  Service: Gynecology;  Laterality: Left;   LAPAROSCOPY  05/10/2021   Procedure: LAPAROSCOPY DIAGNOSTIC;  Surgeon: Philip Aspen, DO;  Location: [facility];  Service: Gynecology;;   TONSILLECTOMY  2006   WISDOM TOOTH EXTRACTION  2006   Social History   Socioeconomic History   Marital status: Married    Spouse name: Not on file   Number of children: 0   Years of education: Not on file   Highest education level: Not on file  Occupational History    Careers adviser  Tobacco Use   Smoking status: Never Smoker   Smokeless tobacco: Never Used  Substance and Sexual Activity   Alcohol use:  Wine or beer 1-2 drinks weekly   Drug use: Never   Sexual activity: Not on file  Other Topics Concern   Not on file  Social History Narrative   Not on file   Social Determinants of Health   Financial Resource Strain:    Difficulty of Paying Living Expenses:   Food Insecurity:    Worried About Programme researcher, broadcasting/film/video in the Last Year:    Barista in the Last Year:   Transportation Needs:    Freight forwarder (Medical):    Lack of Transportation (Non-Medical):   Physical Activity:    Days of Exercise per Week:    Minutes of Exercise per Session:   Stress:    Feeling of Stress :   Social Connections:    Frequency of Communication with Friends and Family:    Frequency of Social Gatherings with Friends and Family:    Attends Religious Services:    Active Member of Clubs or Organizations:    Attends Hospital doctor:    Marital Status:   Intimate Partner Violence:    Fear of Current or Ex-Partner:    Emotionally Abused:    Physically Abused:    Sexually Abused:    Current Outpatient Medications on File Prior to Visit  Medication Sig Dispense Refill   ADDERALL XR 10 MG 24 hr capsule Take 10 mg by mouth daily. When awake in morning     amphetamine-dextroamphetamine (ADDERALL) 15 MG tablet Take 15 mg by mouth 2 (two) times daily. Takes one tablet at 1000 to 1100 and one tablet 1300 to 1400     Aspirin-Salicylamide-Caffeine (BC HEADACHE POWDER PO) Take by mouth as needed.     calcium carbonate (TUMS - DOSED IN MG  ELEMENTAL CALCIUM) 500 MG chewable tablet Chew 1 tablet by mouth as needed for indigestion or heartburn.     FLUoxetine (PROZAC) 20 MG tablet Take 20 mg by mouth daily.     methimazole (TAPAZOLE) 5 MG tablet Take 0.5 tablets (2.5 mg total) by mouth daily. With a meal. 9 tablet 1   No current facility-administered medications on file prior to visit.   Allergies  Allergen Reactions   Thimerosal (Thiomersal) Other (See Comments)    Pt stated had positive allergy test      No family history on file.  PE: BP 122/82   Pulse 81   Ht 5' 7.5" (1.715 m)   Wt 179 lb (81.2 kg)   SpO2 98%   BMI 27.62 kg/m  Wt Readings from Last 3 Encounters:  04/03/23 179 lb (81.2 kg)  06/03/21 211 lb 12.8 oz (96.1 kg)  05/10/21 208 lb 6.4 oz (94.5 kg)   Constitutional: overweight, in NAD Eyes: EOMI, no exophthalmos, no lid lag, no stare ENT: no thyromegaly, no thyroid bruits, no cervical lymphadenopathy Cardiovascular: + Tachycardia, RR, No MRG Respiratory: CTA B Musculoskeletal: no deformities Skin: no rashes Neurological: no tremor with outstretched hands  ASSESSMENT: 1.  Graves' disease  PLAN:  1. Patient with history of Graves' disease, based on her history, elevated TSI antibodies, and family history of the disease.  She returns after long absence of 1 year and 8 months. -After  discussion about possible modalities of treatment for Graves' disease to include methimazole, RAI treatment, and surgery, she chose to proceed with methimazole -She previously had thyrotoxic symptoms: Weight loss, heat intolerance, hyperdefecation, palpitations, anxiety, tremors, most resolved on methimazole.  At last visit, she had only some heat intolerance and palpitations. -At last visit, we continued the same dose of methimazole, 2.5 mg daily however, she mentions that she came off methimazole after changing pharmacies last year. -She feels well, without complaints today.  She is usually tachycardic at the time of her appointment, possibly due to Adderall, but heart rate is normal today -Will check her TFTs and TSI's and if we will need to restart methimazole -No active signs of Graves' ophthalmopathy: No blurry, double vision, eye pain, chemosis -At last visit, she was interested in another pregnancy, but not for now, after her left oophorectomy. Ideally, during pregnancy, she would be off thionamides, however, if we need to use these low-dose, will need to use PTU for the first trimester -I will see her back in a year but possibly sooner for labs.  Orders Placed This Encounter  Procedures   TSH   T4, free   T3, free   Thyroid stimulating immunoglobulin   Needs a refill of methimazole in case we restart this.  Carlus Pavlov, MD PhD [facility] Endocr[facility]

## 2023-04-03 NOTE — Patient Instructions (Addendum)
Please continue off Methimazole.  Come back for labs.  Please come back for a follow-up appointment in 1 year.

## 2023-04-05 LAB — TSH: TSH: 0.062 u[IU]/mL — ABNORMAL LOW (ref 0.450–4.500)

## 2023-04-05 LAB — T4, FREE: Free T4: 1.81 ng/dL — ABNORMAL HIGH (ref 0.82–1.77)

## 2023-04-05 LAB — THYROID STIMULATING IMMUNOGLOBULIN: Thyroid Stim Immunoglobulin: 0.17 IU/L (ref 0.00–0.55)

## 2023-04-05 LAB — T3, FREE: T3, Free: 3.2 pg/mL (ref 2.0–4.4)

## 2023-04-06 MED ORDER — METHIMAZOLE 5 MG PO TABS: 5.0000 mg | ORAL_TABLET | Freq: Every day | ORAL | 3 refills | Status: DC

## 2023-05-01 ENCOUNTER — Other Ambulatory Visit (INDEPENDENT_AMBULATORY_CARE_PROVIDER_SITE_OTHER): Payer: Managed Care, Other (non HMO)

## 2023-05-01 DIAGNOSIS — E0500 Thyrotoxicosis with diffuse goiter without thyrotoxic crisis or storm: Secondary | ICD-10-CM

## 2023-05-01 NOTE — Addendum Note (Signed)
Addended by: Cleda Mccreedy F on: 05/01/2023 02:48 PM   Modules accepted: Orders

## 2023-05-02 LAB — T4, FREE: Free T4: 1 ng/dL (ref 0.8–1.8)

## 2023-05-02 LAB — TSH: TSH: 0.98 mIU/L

## 2023-05-02 LAB — T3, FREE: T3, Free: 3.1 pg/mL (ref 2.3–4.2)

## 2023-05-04 ENCOUNTER — Encounter: Payer: Self-pay | Admitting: Internal Medicine

## 2023-05-04 MED ORDER — METHIMAZOLE 5 MG PO TABS: 5.0000 mg | ORAL_TABLET | Freq: Every day | ORAL | 1 refills | Status: DC

## 2023-07-31 ENCOUNTER — Other Ambulatory Visit (INDEPENDENT_AMBULATORY_CARE_PROVIDER_SITE_OTHER): Payer: Managed Care, Other (non HMO)

## 2023-07-31 ENCOUNTER — Other Ambulatory Visit: Payer: Managed Care, Other (non HMO)

## 2023-07-31 DIAGNOSIS — E0500 Thyrotoxicosis with diffuse goiter without thyrotoxic crisis or storm: Secondary | ICD-10-CM

## 2023-07-31 LAB — TSH: TSH: 2.17 u[IU]/mL (ref 0.35–5.50)

## 2023-07-31 LAB — T3, FREE: T3, Free: 3.4 pg/mL (ref 2.3–4.2)

## 2023-07-31 LAB — T4, FREE: Free T4: 0.87 ng/dL (ref 0.60–1.60)

## 2023-10-25 ENCOUNTER — Other Ambulatory Visit: Payer: Self-pay | Admitting: Internal Medicine

## 2024-02-25 ENCOUNTER — Encounter: Payer: Self-pay | Admitting: Internal Medicine

## 2024-02-28 LAB — LAB REPORT - SCANNED
EGFR: 98
Free T4: 1.14 ng/dL

## 2024-03-07 ENCOUNTER — Encounter: Payer: Self-pay | Admitting: Internal Medicine

## 2024-04-08 ENCOUNTER — Ambulatory Visit: Payer: Managed Care, Other (non HMO) | Admitting: Internal Medicine

## 2024-04-15 ENCOUNTER — Ambulatory Visit: Payer: Managed Care, Other (non HMO) | Admitting: Internal Medicine

## 2024-04-22 ENCOUNTER — Ambulatory Visit: Admitting: Internal Medicine

## 2024-04-22 NOTE — Progress Notes (Deleted)
 Patient ID: Patricia Cruz, female   DOB: 11-23-1986, 38 y.o.   MRN: [id number]   HPI  Patricia Cruz is a 38 y.o.-year-old female, initially referred by her PCP, Dr. Mason Sole, returning for follow-up for Graves' disease.  Last visit 1 year and 10 months ago.   Interim history: Feels well, no complaints today. She has long standing anxiety, occasional palpitations, but not now. She lost 30 lbs since last visit - started Ozempic 02/2022.  Reviewed and addended history: Patient was found to have thyrotoxicosis during investigation for fatigue and during an APE in 01/2020.  She mentions that at the previous annual physical exams, she had normal thyroid  tests, however, she did not have an APE in 2020.  She gave birth to her daughter in 11/2018.  After giving birth, she had more anxiety depression but she attributes this to being out of antidepressants for the period of pregnancy and up to 07/2019.  She started feeling better after she restarted them in 07/2019.  She was also diagnosed with Covid 19 in 07/2019.  She continues to feel fatigued afterwards.  At last visit, her thyroid  tests are still abnormal and her Graves' antibodies were elevated.    We started her on methimazole  5 mg daily.  TFTs improved afterwards.  09/2020: Methimazole  dose reduced to 2.5 mg daily  08/2021: she stopped MMI after switching pharmacies  Reviewed patient's TFTs: Lab Results  Component Value Date   TSH 2.17 07/31/2023   TSH 0.98 05/01/2023   TSH 0.062 (L) 04/03/2023   TSH 0.73 06/07/2021   TSH 1.47 01/23/2021   TSH 2.39 10/16/2020   TSH 0.30 (L) 06/05/2020   TSH <0.01 (L) 04/12/2020   FREET4 1.14 02/28/2024   FREET4 0.87 07/31/2023   FREET4 1.0 05/01/2023   FREET4 1.81 (H) 04/03/2023   FREET4 0.79 06/07/2021   FREET4 0.79 01/23/2021   FREET4 0.78 10/16/2020   FREET4 0.59 (L) 06/05/2020   FREET4 1.08 04/12/2020   T3FREE 3.4 07/31/2023   T3FREE 3.1 05/01/2023   T3FREE 3.2 04/03/2023   T3FREE 3.5  06/07/2021   T3FREE 3.9 01/23/2021   T3FREE 3.8 10/16/2020   T3FREE 3.5 06/05/2020   T3FREE 4.4 (H) 04/12/2020  02/02/2020: TSH <0.005, free T3 6.2 (2-4.4) 01/27/2020: TSH <0.005  Her TSI antibodies were elevated but normalized at last check: Lab Results  Component Value Date   TSI 123 06/07/2021   TSI 157 (H) 04/12/2020   Pt denies: - feeling nodules in neck - hoarseness - dysphagia - choking  At diagnosis, she was mentioning: - + Fatigue - + excessive sweating/heat intolerance - chronic - after pregnancy - no tremors - + anxiety, + depression - exacerbated after pregnancy  - + palpitations - + diarrhea - but has IBS - + weight loss, with losing pregnancy weight + 10 lbs - + hair loss postpartum >> improved  Afterwards: -The above symptoms have improved significantly - palpitations (she is on medication for ADD).  Pt does  have a FH of thyroid  ds.: M with Graves ds., Father with hypothyroidism, MGM with hyperthyroidism and had thyroidectomy, MGGM - also thyroid  ds. No FH of thyroid  cancer. No h/o radiation tx to head or neck. No herbal supplements. No Biotin use. No recent steroids use.   Of note, she had a normal white blood cell count on 01/27/2020: 4.4 (3.4-10.8) she also had normal LFTs at that time. Pt. also has a history of ADD - on Adderall.  ROS: + See HPI  I reviewed pt's medications, allergies, PMH, social hx, family hx, and changes were documented in the history of present illness. Otherwise, unchanged from my initial visit note.  Past Medical History:  Diagnosis Date   ADHD    Anxiety    Atypical nevus 11/05/2012   Left Outer Hip Ant-Moderate, Lefet Outer Hip Post-Moderate and Supra Pubic-Mild   GERD (gastroesophageal reflux disease)    Graves' disease    endocrinology--- dr Timothy Ford--- dx 02/ 2021    Hemorrhoids    History of 2019 novel coronavirus disease (COVID-19) 07/2019   pt stated mild symptoms that resolved   Hx of nausea    with pain meds   IBS  (irritable bowel syndrome)    Left ovarian cyst    Migraine headache    Uterine fibroid    Past Surgical History:  Procedure Laterality Date   LAPAROSCOPIC LYSIS OF ADHESIONS  05/10/2021   Procedure: LAPAROSCOPIC LYSIS OF ADHESIONS;  Surgeon: Atlas Blank, DO;  Location: [facility] [place];  Service: Gynecology;;   LAPAROSCOPIC OVARIAN CYSTECTOMY Left 05/10/2021   Procedure: LAPAROSCOPIC OVARIAN CYSTECTOMY;  Surgeon: Atlas Blank, DO;  Location: [facility] [place];  Service: Gynecology;  Laterality: Left;   LAPAROSCOPIC SALPINGO OOPHERECTOMY Left 05/10/2021   Procedure: LAPAROSCOPIC SALPINGO OOPHORECTOMY;  Surgeon: Atlas Blank, DO;  Location: [facility] [place];  Service: Gynecology;  Laterality: Left;   LAPAROSCOPY  05/10/2021   Procedure: LAPAROSCOPY DIAGNOSTIC;  Surgeon: Atlas Blank, DO;  Location: [facility] [place];  Service: Gynecology;;   TONSILLECTOMY  2006   WISDOM TOOTH EXTRACTION  2006   Social History   Socioeconomic History   Marital status: Married    Spouse name: Not on file   Number of children: 0   Years of education: Not on file   Highest education level: Not on file  Occupational History    Careers adviser  Tobacco Use   Smoking status: Never Smoker   Smokeless tobacco: Never Used  Substance and Sexual Activity   Alcohol use:  Wine or beer 1-2 drinks weekly   Drug use: Never   Sexual activity: Not on file  Other Topics Concern   Not on file  Social History Narrative   Not on file   Social Determinants of Health   Financial Resource Strain:    Difficulty of Paying Living Expenses:   Food Insecurity:    Worried About Programme researcher, broadcasting/film/video in the Last Year:    Barista in the Last Year:   Transportation Needs:    Freight forwarder (Medical):    Lack of Transportation (Non-Medical):   Physical Activity:    Days of Exercise per Week:    Minutes of Exercise per Session:   Stress:    Feeling of  Stress :   Social Connections:    Frequency of Communication with Friends and Family:    Frequency of Social Gatherings with Friends and Family:    Attends Religious Services:    Active Member of Clubs or Organizations:    Attends Engineer, structural:    Marital Status:   Intimate Partner Violence:    Fear of Current or Ex-Partner:    Emotionally Abused:    Physically Abused:    Sexually Abused:    Current Outpatient Medications on File Prior to Visit  Medication Sig Dispense Refill   ADDERALL XR 10 MG 24 hr capsule Take 10 mg by mouth daily. When awake in morning  amphetamine-dextroamphetamine (ADDERALL) 15 MG tablet Take 15 mg by mouth 2 (two) times daily. Takes one tablet at 1000 to 1100 and one tablet 1300 to 1400     Aspirin-Salicylamide-Caffeine (BC HEADACHE POWDER PO) Take by mouth as needed.     calcium carbonate (TUMS - DOSED IN MG ELEMENTAL CALCIUM) 500 MG chewable tablet Chew 1 tablet by mouth as needed for indigestion or heartburn.     FLUoxetine (PROZAC) 20 MG tablet Take 20 mg by mouth daily.     methimazole  (TAPAZOLE ) 5 MG tablet TAKE 1 TABLET BY MOUTH DAILY WITH A MEAL 90 tablet 1   No current facility-administered medications on file prior to visit.   Allergies  Allergen Reactions   Thimerosal (Thiomersal) Other (See Comments)    Pt stated had positive allergy test      No family history on file.  PE: There were no vitals taken for this visit. Wt Readings from Last 3 Encounters:  04/03/23 179 lb (81.2 kg)  06/03/21 211 lb 12.8 oz (96.1 kg)  05/10/21 208 lb 6.4 oz (94.5 kg)   Constitutional: overweight, in NAD Eyes: EOMI, no exophthalmos, no lid lag, no stare ENT: no thyromegaly, no thyroid  bruits, no cervical lymphadenopathy Cardiovascular: + Tachycardia, RR, No MRG Respiratory: CTA B Musculoskeletal: no deformities Skin: no rashes Neurological: no tremor with outstretched hands  ASSESSMENT: 1.  Graves' disease  PLAN:  1. Patient with  history of Graves' disease, based on her history, elevated TSI antibodies and family history of the disease.  She returns after 1 year from the previous visit. - At last visit, she was off methimazole  since 2023 but the TSH was suppressed and her free T4 was elevated so we had to start methimazole  back.  I suggested to start 5 mg of methimazole  daily, which she continues today. - she had 2 sets if TFTs checked since last visit, and 05/11/2023 and 08/11/2023 and these were normal. - at today's visit, she does not complain of thyrotoxic signs or symptoms including palpitations, tremors, heat intolerance, unintentional weight loss, insomnia, anxiety.  She did have such symptoms previously, but they resolved on methimazole . - she has tachycardia at today's exam, but this is common for her, possibly due to Adderall - We again discussed about treatment options for Graves' disease including methimazole  use, RAI treatment, or surgery.  As she is responding well to methimazole , we will continue with this for now. - At today's visit we will recheck her TFTs and TSI's - No active signs of Graves' ophthalmopathy including blurry vision, double vision, eye pain, chemosis - she not interested in another pregnancy for now.  We discussed that whenever trying to get pregnant, she would ideally be off thionamides and with normal TFTs.  However, we could switch to PTU for the first trimester, if thionamides are still needed. - I we will see her back in a year, but in 6 months for labs.  No orders of the defined types were placed in this encounter.  Emilie Harden, MD PhD [facility]

## 2024-08-15 ENCOUNTER — Ambulatory Visit: Admitting: Internal Medicine

## 2024-08-15 NOTE — Progress Notes (Deleted)
 Patient ID: Patricia Cruz, female   DOB: 1986/03/08, 38 y.o.   MRN: [id number]   HPI  Patricia Cruz is a 38 y.o.-year-old female, initially referred by her PCP, Dr. Maree, returning for follow-up for Graves' disease.  Last visit 1 year and 4 months ago.   Interim history: Feels well, no complaints today. She has long standing anxiety, occasional palpitations, but not recently. She lost 30 lbs before last visit after starting Ozempic in  02/2022.  Reviewed and addended history: Patient was found to have thyrotoxicosis during investigation for fatigue and during an APE in 01/2020.  She mentions that at the previous annual physical exams, she had normal thyroid  tests, however, she did not have an APE in 2020.  She gave birth to her daughter in 11/2018.  After giving birth, she had more anxiety depression but she attributes this to being out of antidepressants for the period of pregnancy and up to 07/2019.  She started feeling better after she restarted them in 07/2019.  She was also diagnosed with Covid 19 in 07/2019.  She continues to feel fatigued afterwards.  At last visit, her thyroid  tests are still abnormal and her Graves' antibodies were elevated.    We started her on methimazole  5 mg daily.  TFTs improved afterwards.  09/2020: Methimazole  dose reduced to 2.5 mg daily  08/2021: she stopped MMI after switching pharmacies  03/2023: Restarted MMI at 5 mg daily  Reviewed patient's TFTs: Lab Results  Component Value Date   TSH 2.17 07/31/2023   TSH 0.98 05/01/2023   TSH 0.062 (L) 04/03/2023   TSH 0.73 06/07/2021   TSH 1.47 01/23/2021   TSH 2.39 10/16/2020   TSH 0.30 (L) 06/05/2020   TSH <0.01 (L) 04/12/2020   FREET4 1.14 02/28/2024   FREET4 0.87 07/31/2023   FREET4 1.0 05/01/2023   FREET4 1.81 (H) 04/03/2023   FREET4 0.79 06/07/2021   FREET4 0.79 01/23/2021   FREET4 0.78 10/16/2020   FREET4 0.59 (L) 06/05/2020   FREET4 1.08 04/12/2020   T3FREE 3.4 07/31/2023   T3FREE 3.1  05/01/2023   T3FREE 3.2 04/03/2023   T3FREE 3.5 06/07/2021   T3FREE 3.9 01/23/2021   T3FREE 3.8 10/16/2020   T3FREE 3.5 06/05/2020   T3FREE 4.4 (H) 04/12/2020  02/02/2020: TSH <0.005, free T3 6.2 (2-4.4) 01/27/2020: TSH <0.005  Her TSI antibodies were elevated but normalized at last check: Lab Results  Component Value Date   TSI 123 06/07/2021   TSI 157 (H) 04/12/2020   Pt denies: - feeling nodules in neck - hoarseness - dysphagia - choking  At diagnosis, she was mentioning: - + Fatigue - + excessive sweating/heat intolerance - chronic - after pregnancy - no tremors - + anxiety, + depression - exacerbated after pregnancy  - + palpitations - + diarrhea - but has IBS - + weight loss, with losing pregnancy weight + 10 lbs - + hair loss postpartum >> improved  Afterwards: -The above symptoms have improved significantly - palpitations (she is on medication for ADD).  Pt does  have a FH of thyroid  ds.: M with Graves ds., Father with hypothyroidism, MGM with hyperthyroidism and had thyroidectomy, MGGM - also thyroid  ds. No FH of thyroid  cancer. No h/o radiation tx to head or neck. No herbal supplements. No Biotin use. No recent steroids use.   Of note, she had a normal white blood cell count on 01/27/2020: 4.4 (3.4-10.8) she also had normal LFTs at that time. Pt. also has a history of  ADD - on Adderall.  ROS: + See HPI  I reviewed pt's medications, allergies, PMH, social hx, family hx, and changes were documented in the history of present illness. Otherwise, unchanged from my initial visit note.  Past Medical History:  Diagnosis Date   ADHD    Anxiety    Atypical nevus 11/05/2012   Left Outer Hip Ant-Moderate, Lefet Outer Hip Post-Moderate and Supra Pubic-Mild   GERD (gastroesophageal reflux disease)    Graves' disease    endocrinology--- dr coral--- dx 02/ 2021    Hemorrhoids    History of 2019 novel coronavirus disease (COVID-19) 07/2019   pt stated mild symptoms that  resolved   Hx of nausea    with pain meds   IBS (irritable bowel syndrome)    Left ovarian cyst    Migraine headache    Uterine fibroid    Past Surgical History:  Procedure Laterality Date   LAPAROSCOPIC LYSIS OF ADHESIONS  05/10/2021   Procedure: LAPAROSCOPIC LYSIS OF ADHESIONS;  Surgeon: Lilton Legions, DO;  Location: [facility] [place];  Service: Gynecology;;   LAPAROSCOPIC OVARIAN CYSTECTOMY Left 05/10/2021   Procedure: LAPAROSCOPIC OVARIAN CYSTECTOMY;  Surgeon: Lilton Legions, DO;  Location: [facility] [place];  Service: Gynecology;  Laterality: Left;   LAPAROSCOPIC SALPINGO OOPHERECTOMY Left 05/10/2021   Procedure: LAPAROSCOPIC SALPINGO OOPHORECTOMY;  Surgeon: Lilton Legions, DO;  Location: [facility] [place];  Service: Gynecology;  Laterality: Left;   LAPAROSCOPY  05/10/2021   Procedure: LAPAROSCOPY DIAGNOSTIC;  Surgeon: Lilton Legions, DO;  Location: [facility] [place];  Service: Gynecology;;   TONSILLECTOMY  2006   WISDOM TOOTH EXTRACTION  2006   Social History   Socioeconomic History   Marital status: Married    Spouse name: Not on file   Number of children: 0   Years of education: Not on file   Highest education level: Not on file  Occupational History    Careers adviser  Tobacco Use   Smoking status: Never Smoker   Smokeless tobacco: Never Used  Substance and Sexual Activity   Alcohol use:  Wine or beer 1-2 drinks weekly   Drug use: Never   Sexual activity: Not on file  Other Topics Concern   Not on file  Social History Narrative   Not on file   Social Determinants of Health   Financial Resource Strain:    Difficulty of Paying Living Expenses:   Food Insecurity:    Worried About Programme researcher, broadcasting/film/video in the Last Year:    Barista in the Last Year:   Transportation Needs:    Freight forwarder (Medical):    Lack of Transportation (Non-Medical):   Physical Activity:    Days of Exercise per Week:    Minutes  of Exercise per Session:   Stress:    Feeling of Stress :   Social Connections:    Frequency of Communication with Friends and Family:    Frequency of Social Gatherings with Friends and Family:    Attends Religious Services:    Active Member of Clubs or Organizations:    Attends Engineer, structural:    Marital Status:   Intimate Partner Violence:    Fear of Current or Ex-Partner:    Emotionally Abused:    Physically Abused:    Sexually Abused:    Current Outpatient Medications on File Prior to Visit  Medication Sig Dispense Refill   ADDERALL XR 10 MG 24 hr capsule Take 10 mg by  mouth daily. When awake in morning     amphetamine-dextroamphetamine (ADDERALL) 15 MG tablet Take 15 mg by mouth 2 (two) times daily. Takes one tablet at 1000 to 1100 and one tablet 1300 to 1400     Aspirin-Salicylamide-Caffeine (BC HEADACHE POWDER PO) Take by mouth as needed.     calcium carbonate (TUMS - DOSED IN MG ELEMENTAL CALCIUM) 500 MG chewable tablet Chew 1 tablet by mouth as needed for indigestion or heartburn.     FLUoxetine (PROZAC) 20 MG tablet Take 20 mg by mouth daily.     methimazole  (TAPAZOLE ) 5 MG tablet TAKE 1 TABLET BY MOUTH DAILY WITH A MEAL 90 tablet 1   No current facility-administered medications on file prior to visit.   Allergies  Allergen Reactions   Thimerosal (Thiomersal) Other (See Comments)    Pt stated had positive allergy test      No family history on file.  PE: There were no vitals taken for this visit. Wt Readings from Last 3 Encounters:  04/03/23 179 lb (81.2 kg)  06/03/21 211 lb 12.8 oz (96.1 kg)  05/10/21 208 lb 6.4 oz (94.5 kg)   Constitutional: overweight, in NAD Eyes: EOMI, no exophthalmos, no lid lag, no stare ENT: no thyromegaly, no cervical lymphadenopathy Cardiovascular: RRR, No MRG Respiratory: CTA B Musculoskeletal: no deformities Skin: no rashes Neurological: no tremor with outstretched hands  ASSESSMENT: 1.  Graves' disease  PLAN:   1. Patient with history of Graves' disease based on her history, elevated TSI antibodies and family history of the disease.  She again returns after longer absence, of 1 year and 4 months this time. - At last visit, she was off methimazole  after changing pharmacies and not refilling the prescription in 2023.  However, her TSH was suppressed and free T4 oh was increased at last visit so we had to start back methimazole  5 mg daily in 03/2023. TFTs normalized in 04/2023.  We continued the same dose of methimazole .   - She had repeat free thyroid  hormones by PCP in 02/2024 but unfortunately I do not have a TSH, only a free T4 and free T3, which were both normal - At today's visit, she does not have thyrotoxic symptoms.  She is usually tachycardic at the time of her appointments, possibly due to Adderall.  She previously had weight loss, heat intolerance, hyperdefecation, palpitations, anxiety, tremors, and most of these resolved on methimazole  with the exception of some heat intolerance and occasional palpitations.  - At today's visit we will recheck her TFTs and change the methimazole  dose as needed - No active signs of Graves' ophthalmopathy: No blurry vision, double vision, eye pain, chemosis - At previous visit, she was interested in another pregnancy but not for now, after her left oophorectomy.  We did discuss that if she decides to get pregnant, ideally she would be off thionamides during the pregnancy but if we need to continue, PTU is the thionamide of choice for the first trimester.  I advised her to let me know as soon as she gets pregnant. -I will see her back in a year but possibly sooner for labs  No orders of the defined types were placed in this encounter.  Lela Fendt, MD PhD [facility] [place] Endocrinology

## 2024-11-04 ENCOUNTER — Encounter: Payer: Self-pay | Admitting: Internal Medicine

## 2024-11-04 ENCOUNTER — Other Ambulatory Visit

## 2024-11-04 ENCOUNTER — Ambulatory Visit: Admitting: Internal Medicine

## 2024-11-04 VITALS — BP 112/60 | HR 95 | Ht 67.5 in | Wt 185.4 lb

## 2024-11-04 DIAGNOSIS — E0500 Thyrotoxicosis with diffuse goiter without thyrotoxic crisis or storm: Secondary | ICD-10-CM | POA: Diagnosis not present

## 2024-11-04 NOTE — Patient Instructions (Addendum)
Please continue off Methimazole.  Please stop at the lab.  Please come back for a follow-up appointment in 6 months.

## 2024-11-04 NOTE — Progress Notes (Addendum)
 Patient ID: Patricia Cruz, female   DOB: November 30, 1986, 38 y.o.   MRN: [id number]   HPI  Patricia Cruz is a 38 y.o.-year-old female, initially referred by her PCP, Dr. Maree, returning for follow-up for Graves' disease.  Last visit 1 year and  7 months ago.   He missed several appointments since last visit.  Interim history: At next visit, she feels well, no complaints other than blurry vision possibly related to dry eyes. She has long standing anxiety, not changed recently. She lost 30 lbs before last visit - started Ozempic 02/2022. Still on this -  microdosing.  No recent weight loss. Menses are regular.  No pregnancy plans for now.  Reviewed and addended history: Patient was found to have thyrotoxicosis during investigation for fatigue and during an APE in 01/2020.  She mentions that at the previous annual physical exams, she had normal thyroid  tests, however, she did not have an APE in 2020.  She gave birth to her daughter in 11/2018.  After giving birth, she had more anxiety depression but she attributes this to being out of antidepressants for the period of pregnancy and up to 07/2019.  She started feeling better after she restarted them in 07/2019.  She was also diagnosed with Covid 19 in 07/2019.  She continues to feel fatigued afterwards.  At last visit, her thyroid  tests are still abnormal and her Graves' antibodies were elevated.    We started her on methimazole  5 mg daily.  TFTs improved afterwards.  09/2020: Methimazole  dose reduced to 2.5 mg daily  08/2021: she stopped MMI after switching pharmacies  03/2023: Restarted MMI 5 mg daily.  07/2024: ran out of MMI  Reviewed patient's TFTs: Lab Results  Component Value Date   TSH 2.17 07/31/2023   TSH 0.98 05/01/2023   TSH 0.062 (L) 04/03/2023   TSH 0.73 06/07/2021   TSH 1.47 01/23/2021   TSH 2.39 10/16/2020   TSH 0.30 (L) 06/05/2020   TSH <0.01 (L) 04/12/2020   FREET4 1.14 02/28/2024   FREET4 0.87 07/31/2023   FREET4 1.0  05/01/2023   FREET4 1.81 (H) 04/03/2023   FREET4 0.79 06/07/2021   FREET4 0.79 01/23/2021   FREET4 0.78 10/16/2020   FREET4 0.59 (L) 06/05/2020   FREET4 1.08 04/12/2020   T3FREE 3.4 07/31/2023   T3FREE 3.1 05/01/2023   T3FREE 3.2 04/03/2023   T3FREE 3.5 06/07/2021   T3FREE 3.9 01/23/2021   T3FREE 3.8 10/16/2020   T3FREE 3.5 06/05/2020   T3FREE 4.4 (H) 04/12/2020  02/02/2020: TSH <0.005, free T3 6.2 (2-4.4) 01/27/2020: TSH <0.005  Her TSI antibodies were elevated but normalized at last check: Lab Results  Component Value Date   TSI 123 06/07/2021   TSI 157 (H) 04/12/2020   Pt denies: - feeling nodules in neck - hoarseness - dysphagia - choking  At diagnosis, she was mentioning: - + Fatigue - + excessive sweating/heat intolerance - chronic - after pregnancy - no tremors - + anxiety, + depression - exacerbated after pregnancy  - + palpitations - + diarrhea - but has IBS - + weight loss, with losing pregnancy weight + 10 lbs - + hair loss postpartum >> improved  Afterwards: -The above symptoms have improved significantly - palpitations (she is on medication for ADD).  Pt does  have a FH of thyroid  ds.: M with Graves ds., Father with hypothyroidism, MGM with hyperthyroidism and had thyroidectomy, MGGM - also thyroid  ds. No FH of thyroid  cancer. No h/o radiation tx to head  or neck. No herbal supplements. No Biotin use. No recent steroids use.   Of note, she had a normal white blood cell count on 01/27/2020: 4.4 (3.4-10.8) she also had normal LFTs at that time. Pt. also has a history of ADD - on Adderall.  ROS: + See HPI  I reviewed pt's medications, allergies, PMH, social hx, family hx, and changes were documented in the history of present illness. Otherwise, unchanged from my initial visit note.  Past Medical History:  Diagnosis Date   ADHD    Anxiety    Atypical nevus 11/05/2012   Left Outer Hip Ant-Moderate, Lefet Outer Hip Post-Moderate and Supra Pubic-Mild   GERD  (gastroesophageal reflux disease)    Graves' disease    endocrinology--- dr coral--- dx 02/ 2021    Hemorrhoids    History of 2019 novel coronavirus disease (COVID-19) 07/2019   pt stated mild symptoms that resolved   Hx of nausea    with pain meds   IBS (irritable bowel syndrome)    Left ovarian cyst    Migraine headache    Uterine fibroid    Past Surgical History:  Procedure Laterality Date   LAPAROSCOPIC LYSIS OF ADHESIONS  05/10/2021   Procedure: LAPAROSCOPIC LYSIS OF ADHESIONS;  Surgeon: Lilton Legions, DO;  Location: [facility];  Service: Gynecology;;   LAPAROSCOPIC OVARIAN CYSTECTOMY Left 05/10/2021   Procedure: LAPAROSCOPIC OVARIAN CYSTECTOMY;  Surgeon: Lilton Legions, DO;  Location: [facility];  Service: Gynecology;  Laterality: Left;   LAPAROSCOPIC SALPINGO OOPHERECTOMY Left 05/10/2021   Procedure: LAPAROSCOPIC SALPINGO OOPHORECTOMY;  Surgeon: Lilton Legions, DO;  Location: [facility] [place];  Service: Gynecology;  Laterality: Left;   LAPAROSCOPY  05/10/2021   Procedure: LAPAROSCOPY DIAGNOSTIC;  Surgeon: Lilton Legions, DO;  Location: [facility] ;  Service: Gynecology;;   TONSILLECTOMY  2006   WISDOM TOOTH EXTRACTION  2006   Social History   Socioeconomic History   Marital status: Married    Spouse name: Not on file   Number of children: 0   Years of education: Not on file   Highest education level: Not on file  Occupational History    Careers adviser  Tobacco Use   Smoking status: Never Smoker   Smokeless tobacco: Never Used  Substance and Sexual Activity   Alcohol use:  Wine or beer 1-2 drinks weekly   Drug use: Never   Sexual activity: Not on file  Other Topics Concern   Not on file  Social History Narrative   Not on file   Social Determinants of Health   Financial Resource Strain:    Difficulty of Paying Living Expenses:   Food Insecurity:    Worried About Programme Researcher, Broadcasting/film/video in the Last  Year:    Barista in the Last Year:   Transportation Needs:    Freight Forwarder (Medical):    Lack of Transportation (Non-Medical):   Physical Activity:    Days of Exercise per Week:    Minutes of Exercise per Session:   Stress:    Feeling of Stress :   Social Connections:    Frequency of Communication with Friends and Family:    Frequency of Social Gatherings with Friends and Family:    Attends Religious Services:    Active Member of Clubs or Organizations:    Attends Banker Meetings:    Marital Status:   Intimate Partner Violence:    Fear of Current or Ex-Partner:  Emotionally Abused:    Physically Abused:    Sexually Abused:    Current Outpatient Medications on File Prior to Visit  Medication Sig Dispense Refill   ADDERALL XR 10 MG 24 hr capsule Take 10 mg by mouth daily. When awake in morning     amphetamine-dextroamphetamine (ADDERALL) 15 MG tablet Take 15 mg by mouth 2 (two) times daily. Takes one tablet at 1000 to 1100 and one tablet 1300 to 1400     Aspirin-Salicylamide-Caffeine (BC HEADACHE POWDER PO) Take by mouth as needed.     calcium carbonate (TUMS - DOSED IN MG ELEMENTAL CALCIUM) 500 MG chewable tablet Chew 1 tablet by mouth as needed for indigestion or heartburn.     FLUoxetine (PROZAC) 20 MG tablet Take 20 mg by mouth daily.     methimazole  (TAPAZOLE ) 5 MG tablet TAKE 1 TABLET BY MOUTH DAILY WITH A MEAL 90 tablet 1   No current facility-administered medications on file prior to visit.   Allergies  Allergen Reactions   Thimerosal (Thiomersal) Other (See Comments)    Pt stated had positive allergy test     No family history on file.  PE: BP 112/60   Pulse 95   Ht 5' 7.5 (1.715 m)   Wt 185 lb 6.4 oz (84.1 kg)   SpO2 98%   BMI 28.61 kg/m  Wt Readings from Last 3 Encounters:  11/04/24 185 lb 6.4 oz (84.1 kg)  04/03/23 179 lb (81.2 kg)  06/03/21 211 lb 12.8 oz (96.1 kg)   Constitutional: overweight, in NAD Eyes: EOMI, no  exophthalmos, no lid lag, no stare ENT: no thyromegaly, no thyroid  bruits, no cervical lymphadenopathy Cardiovascular: + Tachycardia, RR, No MRG Respiratory: CTA B Musculoskeletal: no deformities Skin: no rashes Neurological: + tremor with outstretched hands L>R  ASSESSMENT: 1.  Graves' disease  PLAN:  1. Patient with history of Graves' disease returning after a long absence of 1 year and 7 months.  She is not usually compliant with appointments.  She had several no-shows since last visit.  She has a history of Graves' disease based on history, elevated TSI antibodies and family history of the disease.  She previously had weight loss, heat intolerance, hyperdefecation, palpitations, anxiety, tremors, most resolved on methimazole .  We were able to keep her on the low-dose of methimazole , 2.5 mg daily, however, she came off methimazole  by herself after running out of the medication.  At our last visit, her TFTs were in the thyrotoxic range and I advised her to start 5 mg of methimazole  and return for labs in 1.5 months.  She did not return for the labs. I received labs from PCP showing a normal free T4 in 02/28/2024.  However, there was no associated TSH with it. - She mentions that approximately 3 months ago she ran out of her methimazole .  She did not feel differently afterwards.  I strongly advised her not to run out of methimazole  in the future. - She feels well, without complaints today.  She is usually tachycardic at the time of the appointment, possibly due to Adderall.  However, at today's visit, she also has a tremor, which I did not notice before. - At today's visit we will check TFTs and her TSI's.  We may need to restart methimazole  afterwards. - she has no active signs of Graves' ophthalmopathy: No blurry vision, double vision, eye pain, chemosis, but she does have dry eyes.  I advised her to see ophthalmology. - She was previously interested in  another pregnancy.  We again discussed that  PTU is indicated for the first trimester and methimazole  afterwards, but ideally she would be off thionamides before the pregnancy.  At this visit, she mentions that she has regular menstrual cycles.  She is not interested in a new pregnancy. -I will see her back in 6 months but likely sooner for labs  Orders Placed This Encounter  Procedures   TSH   T4, free   T3, free   Thyroid  stimulating immunoglobulin   Component     Latest Ref Rng 11/04/2024  TSH     mIU/L 1.08   T4,Free(Direct)     0.8 - 1.8 ng/dL 1.2   Triiodothyronine,Free,Serum     2.3 - 4.2 pg/mL 3.2   TSIs pending.  However, TFTs are all normal!  No need to restart methimazole .  I will suggest to recheck her TFTs in approximately 3 months, but no intervention is needed for now.  Lela Fendt, MD PhD [facility] Endocrinology

## 2024-11-07 ENCOUNTER — Ambulatory Visit: Payer: Self-pay | Admitting: Internal Medicine

## 2024-11-07 NOTE — Addendum Note (Signed)
 Addended by: TRIXIE FILE on: 11/07/2024 01:12 PM   Modules accepted: Orders

## 2024-11-08 LAB — T3, FREE: T3, Free: 3.2 pg/mL (ref 2.3–4.2)

## 2024-11-08 LAB — THYROID STIMULATING IMMUNOGLOBULIN: TSI: <89 %{baseline} (ref <140)

## 2024-11-08 LAB — TSH: TSH: 1.08 m[IU]/L

## 2024-11-08 LAB — T4, FREE: Free T4: 1.2 ng/dL (ref 0.8–1.8)
